# Patient Record
Sex: Female | Born: 1960
Health system: Southern US, Community
[De-identification: ages and names within clinical notes are randomized; demographics above are authoritative.]

## PROBLEM LIST (undated history)

## (undated) DIAGNOSIS — M719 Bursopathy, unspecified: Secondary | ICD-10-CM

## (undated) DIAGNOSIS — M329 Systemic lupus erythematosus, unspecified: Secondary | ICD-10-CM

## (undated) DIAGNOSIS — Z8616 Personal history of COVID-19: Secondary | ICD-10-CM

## (undated) DIAGNOSIS — I6381 Other cerebral infarction due to occlusion or stenosis of small artery: Secondary | ICD-10-CM

## (undated) DIAGNOSIS — R7303 Prediabetes: Secondary | ICD-10-CM

## (undated) DIAGNOSIS — S42309A Unspecified fracture of shaft of humerus, unspecified arm, initial encounter for closed fracture: Secondary | ICD-10-CM

## (undated) DIAGNOSIS — F329 Major depressive disorder, single episode, unspecified: Secondary | ICD-10-CM

## (undated) DIAGNOSIS — F411 Generalized anxiety disorder: Secondary | ICD-10-CM

## (undated) DIAGNOSIS — S43429A Sprain of unspecified rotator cuff capsule, initial encounter: Secondary | ICD-10-CM

## (undated) DIAGNOSIS — M1990 Unspecified osteoarthritis, unspecified site: Secondary | ICD-10-CM

## (undated) DIAGNOSIS — D595 Paroxysmal nocturnal hemoglobinuria [Marchiafava-Micheli]: Secondary | ICD-10-CM

## (undated) DIAGNOSIS — F909 Attention-deficit hyperactivity disorder, unspecified type: Secondary | ICD-10-CM

## (undated) DIAGNOSIS — G4733 Obstructive sleep apnea (adult) (pediatric): Secondary | ICD-10-CM

## (undated) HISTORY — DX: Sprain of unspecified rotator cuff capsule, initial encounter: S43.429A

## (undated) HISTORY — DX: Generalized anxiety disorder: F41.1

## (undated) HISTORY — DX: Major depressive disorder, single episode, unspecified: F32.9

## (undated) HISTORY — PX: SKIN GRAFT: SHX250

## (undated) HISTORY — DX: Other cerebral infarction due to occlusion or stenosis of small artery: I63.81

## (undated) HISTORY — DX: Obstructive sleep apnea (adult) (pediatric): G47.33

## (undated) HISTORY — DX: Unspecified osteoarthritis, unspecified site: M19.90

## (undated) HISTORY — DX: Unspecified fracture of shaft of humerus, unspecified arm, initial encounter for closed fracture: S42.309A

## (undated) HISTORY — PX: OTHER SURGICAL HISTORY: SHX169

## (undated) HISTORY — PX: WISDOM TOOTH EXTRACTION: SHX21

## (undated) HISTORY — PX: DENTAL SURGERY: SHX609

## (undated) HISTORY — DX: Personal history of COVID-19: Z86.16

## (undated) HISTORY — DX: Attention-deficit hyperactivity disorder, unspecified type: F90.9

## (undated) HISTORY — DX: Bursopathy, unspecified: M71.9

## (undated) HISTORY — DX: Prediabetes: R73.03

## (undated) HISTORY — PX: TONSILLECTOMY AND ADENOIDECTOMY: SUR1326

---

## 1984-11-09 HISTORY — PX: TUBAL LIGATION: SHX77

## 1999-07-08 ENCOUNTER — Ambulatory Visit (HOSPITAL_COMMUNITY): Admission: RE | Admit: 1999-07-08 | Discharge: 1999-07-08 | Payer: Self-pay | Admitting: *Deleted

## 2000-04-28 ENCOUNTER — Other Ambulatory Visit: Admission: RE | Admit: 2000-04-28 | Discharge: 2000-04-28 | Payer: Self-pay | Admitting: *Deleted

## 2001-04-13 ENCOUNTER — Other Ambulatory Visit: Admission: RE | Admit: 2001-04-13 | Discharge: 2001-04-13 | Payer: Self-pay | Admitting: *Deleted

## 2002-12-14 ENCOUNTER — Other Ambulatory Visit: Admission: RE | Admit: 2002-12-14 | Discharge: 2002-12-14 | Payer: Self-pay | Admitting: *Deleted

## 2004-10-27 ENCOUNTER — Other Ambulatory Visit: Admission: RE | Admit: 2004-10-27 | Discharge: 2004-10-27 | Payer: Self-pay | Admitting: *Deleted

## 2004-12-08 ENCOUNTER — Ambulatory Visit (HOSPITAL_COMMUNITY): Admission: RE | Admit: 2004-12-08 | Discharge: 2004-12-08 | Payer: Self-pay | Admitting: Gastroenterology

## 2004-12-08 ENCOUNTER — Encounter (INDEPENDENT_AMBULATORY_CARE_PROVIDER_SITE_OTHER): Payer: Self-pay | Admitting: *Deleted

## 2006-12-28 ENCOUNTER — Other Ambulatory Visit: Admission: RE | Admit: 2006-12-28 | Discharge: 2006-12-28 | Payer: Self-pay | Admitting: Obstetrics & Gynecology

## 2008-07-23 ENCOUNTER — Other Ambulatory Visit: Admission: RE | Admit: 2008-07-23 | Discharge: 2008-07-23 | Payer: Self-pay | Admitting: Obstetrics and Gynecology

## 2011-03-27 NOTE — Op Note (Signed)
NAME:  Diane Moody, Diane Moody              ACCOUNT NO.:  [id number]   MEDICAL RECORD NO.:  [id number]          PATIENT TYPE:  AMB   LOCATION:  ENDO                         FACILITY:  [facility]   PHYSICIAN:  Anselmo Rod, M.D.  DATE OF BIRTH:  09/21/1961   DATE OF PROCEDURE:  12/08/2004  DATE OF DISCHARGE:                                 OPERATIVE REPORT   PROCEDURE PERFORMED:  Colonoscopy, with multiple biopsies.   ENDOSCOPIST:  Anselmo Rod, M.D.   INSTRUMENT USED:  Olympus video colonoscope.   INDICATIONS FOR PROCEDURE:  Screening colonoscopy being performed in a 17-  year-old white female who has a family history of colon cancer in her  father, to rule out colonic polyps, masses, etc.   PRE-PROCEDURE PREPARATION:  Informed consent was procured from the patient.  The patient was fasted for eight hours prior to the procedure and prepped  with a bottle of magnesium citrate and a gallon of GoLYTELY the night prior  to the procedure.  Risks and benefits of the procedure including a 10% miss  rate of cancer and polyps was discussed with the patient as well.   PRE-PROCEDURE PHYSICAL:  VITAL SIGNS:  The patient had stable vital signs.  NECK:  Supple.  CHEST:  Clear to auscultation.  CARDIOVASCULAR:  S1, S2 regular.  ABDOMEN:  Soft, with normal bowel sounds.   DESCRIPTION OF THE PROCEDURE:  The patient was placed in the left lateral  decubitus position, sedated with 100 mg of Demerol and 10 mg of Versed in  slow incremental doses.  Once the patient was adequately sedated and  maintained on low-flow oxygen and continuous cardiac monitoring, the Olympus  video colonoscope was advanced from the rectum to the cecum.  There was a  large amount of residual stool in the colon.  Multiple washings were done.  The patient's position was changed from the left lateral to the supine and  the right lateral position, with gentle application of abdominal pressure to  reach the cecal base.  A patch of  erythema was biopsied from 10 cm.  Small  internal hemorrhoids were seen on retroflexion.  No other masses or polyps  were identified.  There was no evidence of diverticulosis.  Small mucosal  lesions could have been missed in spite of the multiple washings done.   IMPRESSION:  1.  Patchy erythema at 10 cm.  Multiple cold biopsies done.  2.  Small internal hemorrhoids seen on retroflexion.  3.  No masses or polyps seen.  4.  Significant amount of residual stool in the colon.  Small lesions could      have been missed.   RECOMMENDATIONS:  1.  Continue a high-fiber diet, with liberal fluid intake.  2.  Avoid nonsteroidals, including aspirin, for the next four weeks.  3.  Outpatient followup in the next two weeks for further recommendations.      JNM/MEDQ  D:  12/09/2004  T:  12/09/2004  Job:  [id number]   cc:   Milus Banister, P.A.   Talmadge Coventry, M.D.  [address]  [place]  [place]  [address]  Fax: [phone]

## 2012-01-19 LAB — HM PAP SMEAR: HM Pap smear: NEGATIVE

## 2012-03-09 DIAGNOSIS — E538 Deficiency of other specified B group vitamins: Secondary | ICD-10-CM | POA: Insufficient documentation

## 2012-03-09 HISTORY — DX: Deficiency of other specified B group vitamins: E53.8

## 2012-07-22 ENCOUNTER — Ambulatory Visit (INDEPENDENT_AMBULATORY_CARE_PROVIDER_SITE_OTHER): Payer: BC Managed Care – PPO | Admitting: Family Medicine

## 2012-07-22 VITALS — BP 101/68 | HR 89 | Temp 98.0°F | Resp 20 | Ht 66.5 in | Wt 184.0 lb

## 2012-07-22 DIAGNOSIS — T07XXXA Unspecified multiple injuries, initial encounter: Secondary | ICD-10-CM

## 2012-07-22 DIAGNOSIS — S0191XA Laceration without foreign body of unspecified part of head, initial encounter: Secondary | ICD-10-CM

## 2012-07-22 DIAGNOSIS — S0990XA Unspecified injury of head, initial encounter: Secondary | ICD-10-CM

## 2012-07-22 DIAGNOSIS — S0190XA Unspecified open wound of unspecified part of head, initial encounter: Secondary | ICD-10-CM

## 2012-07-22 DIAGNOSIS — W19XXXA Unspecified fall, initial encounter: Secondary | ICD-10-CM

## 2012-07-22 NOTE — Progress Notes (Signed)
Verbal consent obtained from the patient.  Local anesthesia with 3cc Lidocaine 2% with epinephrine.  Wound scrubbed with soap and water and rinsed.  Wound closed with 5 stainless steel staples.  Wound cleansed and dressed.

## 2012-07-22 NOTE — Progress Notes (Signed)
Subjective: Patient was working out steps on her caring some water tripped and fell down a couple of stairs twisted around. She may have hit her head on a car. She scraped and injured her right upper back. She is bleeding from the top of her head down behind her right ear. She thought he might have torn or your she has bruises on her elbow legs and feet.  She has a history of multiple disease processes. She has connective tissue disease for which he takes a number of medications. She is has lupus and paroxysmal nocturnal hemoglobinuria and vitamin B12 deficiency. She bruises and bleeds easily because of her medications. Her last tetanus shot was December of 2011.  She had no loss of consciousness. She went indoors rushes when your husband to come and gotten the time he tried to wash off the dirt and grime.  Objective: Blood in her hair. Examination reveals a 1 CM wound in the right occiput for the top of her head. This looks like a crush abrasion more than a clean cut wound. No other lacerations were noted on the head. Neck is supple. Full range of motion of her shoulders and arms. Marked abrasion and bruising from the right of her neck down her right scapula. She has a bruise on her right elbow. She has a bruise on her knee. There are 2 small puncture wounds on the top of her left foot. Multiple other small bruises on her legs.  Assessment: Abrasion and wound of scalp Fall Multiple contusions Large abrasion right scapula Polypharmacy leading to risk of falls Lupus  Plan: My physician assistant, Larey Days, will anesthetize and evaluate the wound as to whether it needs sutures or not.  She will have a lot of aches and pains, but I do not believe anything else was seriously injured.

## 2012-07-22 NOTE — Patient Instructions (Addendum)
WOUND CARE Please return in 7-10 days to have your stitches/staples removed or sooner if you have concerns. Marland Kitchen Keep area clean and dry for 24 hours. Do not remove bandage, if applied. . After 24 hours, remove bandage and wash wound gently with mild soap and warm water. Reapply a new bandage after cleaning wound, if directed. . Continue daily cleansing with soap and water until stitches/staples are removed. . Do not apply any ointments or creams to the wound while stitches/staples are in place, as this may cause delayed healing. . Notify the office if you experience any of the following signs of infection: Swelling, redness, pus drainage, streaking, fever >101.0 F . Notify the office if you experience excessive bleeding that does not stop after 15-20 minutes of constant, firm pressure.  If new problems are found come back in sooner

## 2012-07-30 ENCOUNTER — Ambulatory Visit (INDEPENDENT_AMBULATORY_CARE_PROVIDER_SITE_OTHER): Payer: BC Managed Care – PPO | Admitting: Family Medicine

## 2012-07-30 VITALS — BP 110/70 | HR 67 | Temp 98.3°F | Resp 16 | Ht 67.25 in | Wt 184.4 lb

## 2012-07-30 DIAGNOSIS — S0100XA Unspecified open wound of scalp, initial encounter: Secondary | ICD-10-CM

## 2012-07-30 NOTE — Progress Notes (Signed)
@[email or web address] @  Patient ID: Diane Moody MRN: [id number], DOB: [id number] y.o. Date of Encounter: 07/30/2012, 9:45 AM  Primary Physician: No primary provider on file.  Chief Complaint: Suture removal    See note from earlier this month  HPI: 51 y.o. y/o female with injury to scalp Here for suture removal s/p placement on right scalp Doing well - other bruises and sore areas are resolved No issues/complaints Afebrile/ No chills No erythema No pain Able to move without difficulty Normal sensation  No past medical history on file.   Home Meds: Prior to Admission medications   Medication Sig Start Date End Date Taking? Authorizing Provider  albuterol (PROVENTIL HFA;VENTOLIN HFA) 108 (90 BASE) MCG/ACT inhaler Inhale 2 puffs into the lungs as needed.   Yes Historical Provider, MD  aspirin 81 MG tablet Take 81 mg by mouth daily.   Yes Historical Provider, MD  bisacodyl (BISACODYL) 5 MG EC tablet Take 5 mg by mouth daily as needed.   Yes Historical Provider, MD  buPROPion (WELLBUTRIN XL) 150 MG 24 hr tablet Take 150 mg by mouth daily.   Yes Historical Provider, MD  Calcium Carbonate-Vitamin D (CALCIUM + D PO) Take by mouth daily.   Yes Historical Provider, MD  ciprofloxacin (CIPRO) 500 MG tablet Take 500 mg by mouth as needed.   Yes Historical Provider, MD  COENZYME Q-10 PO Take 60 mg by mouth daily.   Yes Historical Provider, MD  desloratadine (CLARINEX) 5 MG tablet Take 5 mg by mouth daily.   Yes Historical Provider, MD  diclofenac sodium (VOLTAREN) 1 % GEL Apply topically as needed.   Yes Historical Provider, MD  EPINEPHrine (EPIPEN JR) 0.15 MG/0.3ML injection Inject 0.15 mg into the muscle as needed.   Yes Historical Provider, MD  esomeprazole (NEXIUM) 20 MG capsule Take 20 mg by mouth daily before breakfast.   Yes Historical Provider, MD  folic acid (FOLVITE) 1 MG tablet Take 1 mg by mouth daily.   Yes Historical Provider, MD  Homeopathic Products (ARNICA) GEL Apply topically as  needed.   Yes Historical Provider, MD  hydroxychloroquine (PLAQUENIL) 200 MG tablet Take 200 mg by mouth daily.   Yes Historical Provider, MD  hydrOXYzine (ATARAX/VISTARIL) 25 MG tablet Take 25 mg by mouth 3 (three) times daily as needed.   Yes Historical Provider, MD  Lactobacillus Bifidus CAPS Take by mouth daily.   Yes Historical Provider, MD  LORazepam (ATIVAN) 1 MG tablet Take 1 mg by mouth every 8 (eight) hours.   Yes Historical Provider, MD  loteprednol (LOTEMAX) 0.5 % ophthalmic suspension Place 1 drop into both eyes 4 (four) times daily.   Yes Historical Provider, MD  metroNIDAZOLE (METROGEL) 1 % gel Apply topically daily.   Yes Historical Provider, MD  mometasone (NASONEX) 50 MCG/ACT nasal spray Place 2 sprays into the nose daily.   Yes Historical Provider, MD  montelukast (SINGULAIR) 10 MG tablet Take 10 mg by mouth at bedtime.   Yes Historical Provider, MD  mupirocin ointment (BACTROBAN) 2 % Apply topically 3 (three) times daily.   Yes Historical Provider, MD  naproxen sodium (ANAPROX) 220 MG tablet Take 220 mg by mouth 2 (two) times daily with a meal.   Yes Historical Provider, MD  omega-3 acid ethyl esters (LOVAZA) 1 G capsule Take 2 g by mouth 2 (two) times daily.   Yes Historical Provider, MD  PARoxetine (PAXIL) 20 MG tablet Take 20 mg by mouth every morning.   Yes Historical Provider, MD  predniSONE (DELTASONE)  5 MG tablet Take 5 mg by mouth daily.   Yes Historical Provider, MD  Pseudoephedrine HCl (SUDAFED 12 HOUR PO) Take by mouth as needed.   Yes Historical Provider, MD  tiZANidine (ZANAFLEX) 4 MG tablet Take 4 mg by mouth every 6 (six) hours as needed.   Yes Historical Provider, MD  zolpidem (AMBIEN) 10 MG tablet Take 10 mg by mouth at bedtime as needed.   Yes Historical Provider, MD    Allergies:  Allergies  Allergen Reactions  . Augmentin (Amoxicillin-Pot Clavulanate) Rash  . Celebrex (Celecoxib) Rash    Has a sulfa component.  . Nyquil (Pseudoeph-Doxylamine-Dm-Apap)  Rash  . Sulfa Antibiotics Rash    Physical Exam: Blood pressure 110/70, pulse 67, temperature 98.3 F (36.8 C), temperature source Oral, resp. rate 16, height 5' 7.25" (1.708 m), weight 184 lb 6.4 oz (83.643 kg), last menstrual period 07/01/2012, SpO2 97.00%., Body mass index is 28.67 kg/(m^2). General: Well developed, well nourished, in no acute distress. Head: Normocephalic, atraumatic, sclera non-icteric, no xanthomas, nares are without discharge.  Neck: Supple. Lungs: Breathing is unlabored. Heart: Normal rate. Msk:  Strength and tone appear normal for age. Wound:  Wound well health without erythema, swelling, or tenderness to palpation. FROM and 5/5 strength with normal sensation throughout including 2 point discrimination Skin: See above, otherwise dry without rash or erythema. Extremities: No clubbing or cyanosis. No edema. Neuro: Alert and oriented X 3. Moves all extremities spontaneously.  Psych:  Responds to questions appropriately with a normal affect.   PROCEDURE: Verbal consent obtained. 5 staples removed without difficulty.  Assessment and Plan: 51 y.o. y/o female here for suture removal for wound described above. -Sutures removed per above -Wound resolved -RTC prn  Signed, Elvina Sidle, MD 07/30/2012 9:45 AM

## 2012-11-17 ENCOUNTER — Encounter (HOSPITAL_COMMUNITY): Payer: Self-pay | Admitting: Cardiology

## 2012-11-17 ENCOUNTER — Emergency Department (HOSPITAL_COMMUNITY)
Admission: EM | Admit: 2012-11-17 | Discharge: 2012-11-17 | Disposition: A | Discharge status: Home / Self Care | Payer: BC Managed Care – PPO | Attending: Emergency Medicine | Admitting: Emergency Medicine

## 2012-11-17 ENCOUNTER — Emergency Department (HOSPITAL_COMMUNITY): Payer: BC Managed Care – PPO

## 2012-11-17 DIAGNOSIS — S335XXA Sprain of ligaments of lumbar spine, initial encounter: Secondary | ICD-10-CM | POA: Insufficient documentation

## 2012-11-17 DIAGNOSIS — Z79899 Other long term (current) drug therapy: Secondary | ICD-10-CM | POA: Insufficient documentation

## 2012-11-17 DIAGNOSIS — W11XXXA Fall on and from ladder, initial encounter: Secondary | ICD-10-CM | POA: Insufficient documentation

## 2012-11-17 DIAGNOSIS — W19XXXA Unspecified fall, initial encounter: Secondary | ICD-10-CM

## 2012-11-17 DIAGNOSIS — Y9389 Activity, other specified: Secondary | ICD-10-CM | POA: Insufficient documentation

## 2012-11-17 DIAGNOSIS — Z7982 Long term (current) use of aspirin: Secondary | ICD-10-CM | POA: Insufficient documentation

## 2012-11-17 DIAGNOSIS — Z862 Personal history of diseases of the blood and blood-forming organs and certain disorders involving the immune mechanism: Secondary | ICD-10-CM | POA: Insufficient documentation

## 2012-11-17 DIAGNOSIS — Y929 Unspecified place or not applicable: Secondary | ICD-10-CM | POA: Insufficient documentation

## 2012-11-17 DIAGNOSIS — Z8739 Personal history of other diseases of the musculoskeletal system and connective tissue: Secondary | ICD-10-CM | POA: Insufficient documentation

## 2012-11-17 DIAGNOSIS — S39012A Strain of muscle, fascia and tendon of lower back, initial encounter: Secondary | ICD-10-CM

## 2012-11-17 HISTORY — DX: Paroxysmal nocturnal hemoglobinuria (Marchiafava-Micheli): D59.5

## 2012-11-17 HISTORY — DX: Systemic lupus erythematosus, unspecified: M32.9

## 2012-11-17 LAB — URINALYSIS, ROUTINE W REFLEX MICROSCOPIC
Bilirubin Urine: NEGATIVE
Glucose, UA: NEGATIVE mg/dL
Hgb urine dipstick: NEGATIVE
Ketones, ur: NEGATIVE mg/dL
Leukocytes, UA: NEGATIVE
Nitrite: NEGATIVE
Protein, ur: NEGATIVE mg/dL
Specific Gravity, Urine: 1.015 (ref 1.005–1.030)
Urobilinogen, UA: 0.2 mg/dL (ref 0.0–1.0)
pH: 7 (ref 5.0–8.0)

## 2012-11-17 LAB — POCT I-STAT, CHEM 8
BUN: 17 mg/dL (ref 6–23)
Calcium, Ion: 1.19 mmol/L (ref 1.12–1.23)
Chloride: 102 mEq/L (ref 96–112)
Creatinine, Ser: 1 mg/dL (ref 0.50–1.10)
Glucose, Bld: 113 mg/dL — ABNORMAL HIGH (ref 70–99)
HCT: 39 % (ref 36.0–46.0)
Hemoglobin: 13.3 g/dL (ref 12.0–15.0)
Potassium: 3.9 mEq/L (ref 3.5–5.1)
Sodium: 138 mEq/L (ref 135–145)
TCO2: 27 mmol/L (ref 0–100)

## 2012-11-17 LAB — CBC WITH DIFFERENTIAL/PLATELET
Basophils Absolute: 0 10*3/uL (ref 0.0–0.1)
Basophils Relative: 0 % (ref 0–1)
Eosinophils Absolute: 0 10*3/uL (ref 0.0–0.7)
Eosinophils Relative: 0 % (ref 0–5)
HCT: 34.5 % — ABNORMAL LOW (ref 36.0–46.0)
Hemoglobin: 11.2 g/dL — ABNORMAL LOW (ref 12.0–15.0)
Lymphocytes Relative: 11 % — ABNORMAL LOW (ref 12–46)
Lymphs Abs: 0.6 10*3/uL — ABNORMAL LOW (ref 0.7–4.0)
MCH: 32.6 pg (ref 26.0–34.0)
MCHC: 32.5 g/dL (ref 30.0–36.0)
MCV: 100.3 fL — ABNORMAL HIGH (ref 78.0–100.0)
Monocytes Absolute: 0.6 10*3/uL (ref 0.1–1.0)
Monocytes Relative: 10 % (ref 3–12)
Neutro Abs: 4.8 10*3/uL (ref 1.7–7.7)
Neutrophils Relative %: 79 % — ABNORMAL HIGH (ref 43–77)
Platelets: 140 10*3/uL — ABNORMAL LOW (ref 150–400)
RBC: 3.44 MIL/uL — ABNORMAL LOW (ref 3.87–5.11)
RDW: 13.6 % (ref 11.5–15.5)
WBC: 6 10*3/uL (ref 4.0–10.5)

## 2012-11-17 MED ORDER — OXYCODONE-ACETAMINOPHEN 5-325 MG PO TABS
1.0000 | ORAL_TABLET | Freq: Once | ORAL | Status: AC
  Administered 2012-11-17: 1 via ORAL
  Filled 2012-11-17: qty 1

## 2012-11-17 MED ORDER — CYCLOBENZAPRINE HCL 10 MG PO TABS
10.0000 mg | ORAL_TABLET | Freq: Once | ORAL | Status: AC
  Administered 2012-11-17: 10 mg via ORAL
  Filled 2012-11-17: qty 1

## 2012-11-17 MED ORDER — OXYCODONE-ACETAMINOPHEN 5-325 MG PO TABS: 1.0000 | ORAL_TABLET | Freq: Four times a day (QID) | ORAL | Status: DC | PRN

## 2012-11-17 NOTE — ED Notes (Signed)
The pt is alert skin warm and dry.  She has pain in her lower back  And abd.  She describes it as like period cramps.  Urine just sent

## 2012-11-17 NOTE — ED Provider Notes (Signed)
History     CSN: [id number]  Arrival date & time 11/17/12  1158   First MD Initiated Contact with Patient 11/17/12 1213      Chief Complaint  Patient presents with  . Fall    (Consider location/radiation/quality/duration/timing/severity/associated sxs/prior treatment) Patient is a 52 y.o. female presenting with fall. The history is provided by the patient.  Fall The accident occurred 1 to 2 hours ago. The fall occurred from a ladder. She fell from a height of 3 to 5 ft. She landed on a hard floor. There was no blood loss. Point of impact: back and left hip. The pain is at a severity of 5/10. The pain is moderate. She was ambulatory at the scene. Pertinent negatives include no numbness, no abdominal pain, no nausea, no vomiting, no headaches, no loss of consciousness and no tingling. The symptoms are aggravated by activity. She has tried nothing for the symptoms. The treatment provided no relief.    Past Medical History  Diagnosis Date  . Lupus   . PNH (paroxysmal nocturnal hemoglobinuria)     Past Surgical History  Procedure Date  . Skin graft     History reviewed. No pertinent family history.  History  Substance Use Topics  . Smoking status: Never Smoker   . Smokeless tobacco: Not on file  . Alcohol Use: No    OB History    Grav Para Term Preterm Abortions TAB SAB Ect Mult Living                  Review of Systems  Gastrointestinal: Negative for nausea, vomiting and abdominal pain.  Neurological: Negative for tingling, loss of consciousness, numbness and headaches.  All other systems reviewed and are negative.    Allergies  Augmentin; Celebrex; Nyquil; and Sulfa antibiotics  Home Medications   Current Outpatient Rx  Name  Route  Sig  Dispense  Refill  . ALBUTEROL SULFATE HFA 108 (90 BASE) MCG/ACT IN AERS   Inhalation   Inhale 2 puffs into the lungs as needed.         . ASPIRIN 81 MG PO TABS   Oral   Take 81 mg by mouth daily.         Marland Kitchen BISACODYL  5 MG PO TBEC   Oral   Take 5 mg by mouth daily as needed.         . BUPROPION HCL ER (XL) 150 MG PO TB24   Oral   Take 150 mg by mouth daily.         Marland Kitchen CALCIUM + D PO   Oral   Take by mouth daily.         Marland Kitchen CIPROFLOXACIN HCL 500 MG PO TABS   Oral   Take 500 mg by mouth as needed.         Marland Kitchen COENZYME Q-10 PO   Oral   Take 60 mg by mouth daily.         . DESLORATADINE 5 MG PO TABS   Oral   Take 5 mg by mouth daily.         Marland Kitchen DICLOFENAC SODIUM 1 % TD GEL   Topical   Apply topically as needed.         Marland Kitchen EPINEPHRINE 0.15 MG/0.3ML IJ DEVI   Intramuscular   Inject 0.15 mg into the muscle as needed.         Marland Kitchen ESOMEPRAZOLE MAGNESIUM 20 MG PO CPDR   Oral   Take  20 mg by mouth daily before breakfast.         . FOLIC ACID 1 MG PO TABS   Oral   Take 1 mg by mouth daily.         . ARNICA EX GEL   Apply externally   Apply topically as needed.         Marland Kitchen HYDROXYCHLOROQUINE SULFATE 200 MG PO TABS   Oral   Take 200 mg by mouth daily.         Marland Kitchen HYDROXYZINE HCL 25 MG PO TABS   Oral   Take 25 mg by mouth 3 (three) times daily as needed.         Marland Kitchen LACTOBACILLUS BIFIDUS PO CAPS   Oral   Take by mouth daily.         Marland Kitchen LORAZEPAM 1 MG PO TABS   Oral   Take 1 mg by mouth every 8 (eight) hours.         Marland Kitchen LOTEPREDNOL ETABONATE 0.5 % OP SUSP   Both Eyes   Place 1 drop into both eyes 4 (four) times daily.         Marland Kitchen METRONIDAZOLE 1 % EX GEL   Topical   Apply topically daily.         . MOMETASONE FUROATE 50 MCG/ACT NA SUSP   Nasal   Place 2 sprays into the nose daily.         Marland Kitchen MONTELUKAST SODIUM 10 MG PO TABS   Oral   Take 10 mg by mouth at bedtime.         Marland Kitchen MUPIROCIN 2 % EX OINT   Topical   Apply topically 3 (three) times daily.         Marland Kitchen NAPROXEN SODIUM 220 MG PO TABS   Oral   Take 220 mg by mouth 2 (two) times daily with a meal.         . OMEGA-3-ACID ETHYL ESTERS 1 G PO CAPS   Oral   Take 2 g by mouth 2 (two) times  daily.         Marland Kitchen PAROXETINE HCL 20 MG PO TABS   Oral   Take 20 mg by mouth every morning.         Marland Kitchen PREDNISONE 5 MG PO TABS   Oral   Take 5 mg by mouth daily.         . SUDAFED 12 HOUR PO   Oral   Take by mouth as needed.         Marland Kitchen TIZANIDINE HCL 4 MG PO TABS   Oral   Take 4 mg by mouth every 6 (six) hours as needed.         Marland Kitchen ZOLPIDEM TARTRATE 10 MG PO TABS   Oral   Take 10 mg by mouth at bedtime as needed.           BP 117/66  Temp 97.9 F (36.6 C) (Oral)  Resp 20  SpO2 97%  Physical Exam  Nursing note and vitals reviewed. Constitutional: She is oriented to person, place, and time. She appears well-developed and well-nourished. No distress.  HENT:  Head: Normocephalic and atraumatic.  Mouth/Throat: Oropharynx is clear and moist.  Eyes: Conjunctivae normal and EOM are normal. Pupils are equal, round, and reactive to light.  Neck: Normal range of motion. Neck supple.  Cardiovascular: Normal rate, regular rhythm and intact distal pulses.   No murmur heard. Pulmonary/Chest: Effort normal and breath  sounds normal. No respiratory distress. She has no wheezes. She has no rales. She exhibits no tenderness.  Abdominal: Soft. She exhibits no distension. There is no tenderness. There is no rebound and no guarding.  Musculoskeletal: Normal range of motion. She exhibits no edema and no tenderness.       Lumbar back: She exhibits tenderness, pain and spasm. She exhibits normal range of motion, no bony tenderness, no deformity and normal pulse.       Back:       Right hand: She exhibits tenderness and swelling. She exhibits normal range of motion, normal capillary refill, no deformity and no laceration.       Hands: Neurological: She is alert and oriented to person, place, and time.  Skin: Skin is warm and dry. No rash noted. No erythema.  Psychiatric: She has a normal mood and affect. Her behavior is normal.    ED Course  Procedures (including critical care  time)  Labs Reviewed  CBC WITH DIFFERENTIAL - Abnormal; Notable for the following:    RBC 3.44 (*)     Hemoglobin 11.2 (*)     HCT 34.5 (*)     MCV 100.3 (*)     Platelets 140 (*)     Neutrophils Relative 79 (*)     Lymphocytes Relative 11 (*)     Lymphs Abs 0.6 (*)     All other components within normal limits  POCT I-STAT, CHEM 8 - Abnormal; Notable for the following:    Glucose, Bld 113 (*)     All other components within normal limits  URINALYSIS, ROUTINE W REFLEX MICROSCOPIC   Dg Lumbar Spine Complete  11/17/2012  *RADIOLOGY REPORT*  Clinical Data: Fall, back pain  LUMBAR SPINE - COMPLETE 4+ VIEW  Comparison: None.  Findings: Five views of the lumbar spine submitted.  There is mild compression deformity superior endplate of the L2 vertebral body of indeterminate age.  Clinical correlation is necessary. Disc space flattening with vacuum disc phenomenon and mild anterior spurring at L5-S1 level.  Mild disc space flattening at L2-L3 level.  IMPRESSION:  There is mild compression deformity superior endplate of the L2 vertebral body of indeterminate age.  Clinical correlation is necessary. Disc space flattening with vacuum disc phenomenon and mild anterior spurring at L5-S1 level.  Mild disc space flattening at L2-L3 level.   Original Report Authenticated By: Natasha Mead, M.D.    Dg Hip Complete Left  11/17/2012  *RADIOLOGY REPORT*  Clinical Data: Fall  LEFT HIP - COMPLETE 2+ VIEW  Comparison: None.  Findings: Three views of the left hip submitted.  No acute fracture or subluxation.  Bilateral hip joints are symmetrical in appearance.  IMPRESSION: No acute fracture or subluxation.   Original Report Authenticated By: Natasha Mead, M.D.     EMERGENCY DEPARTMENT [place] FAST EXAM  INDICATIONS: blunt trauma to the back from fall  PERFORMED BY: Myself  IMAGES ARCHIVED?: No  FINDINGS: All views negative  LIMITATIONS:  none  INTERPRETATION:  No abdominal free fluid and No pericardial  effusion  COMMENT:  normal    1. Fall   2. Lumbar strain       MDM   Patient with a mechanical fall today from a ladder when she was having a bird feeder. She fell flat on her back and is complaining of left lower para lumbar tenderness. She denies any difficulty urinating and denies any chest pain or shortness of breath. Bedside ultrasound shows normal bilateral kidneys and  bladder without any signs of free fluid.  Patient is on aspirin daily but takes no other anticoagulants. She denies head or head or lose consciousness. Pt does have PNH but states her labs within the last week were wnl.  2:27 PM Films neg except for possible indeterminate L2 endplate deformity.  However pt is not tender over the spine and suspect this is not new.  Hip film neg.  Labs stable.  Pt able to ambulate without pain. Will d/c pt home.        Gwyneth Sprout, MD 11/17/12 1559

## 2012-11-17 NOTE — ED Notes (Signed)
Got pt up, ambulatory to the bathroom to attempt to provide an urine specimen; pt ambulating without difficulty or distress

## 2012-11-17 NOTE — ED Notes (Signed)
Pt reports she was hanging a bird feeder and fell off the ladder about 5 ft. States she landed on her left hip and leg. C/o lower back pain, denies any LOC. No on any blood thinners.

## 2012-12-13 ENCOUNTER — Encounter: Payer: Self-pay | Admitting: Family Medicine

## 2012-12-24 ENCOUNTER — Other Ambulatory Visit: Payer: Self-pay

## 2013-01-26 LAB — HM MAMMOGRAPHY: HM Mammogram: NEGATIVE

## 2013-02-24 DIAGNOSIS — M329 Systemic lupus erythematosus, unspecified: Secondary | ICD-10-CM

## 2013-02-24 DIAGNOSIS — M797 Fibromyalgia: Secondary | ICD-10-CM

## 2013-02-24 HISTORY — DX: Fibromyalgia: M79.7

## 2013-02-24 HISTORY — DX: Systemic lupus erythematosus, unspecified: M32.9

## 2013-05-08 DIAGNOSIS — R7989 Other specified abnormal findings of blood chemistry: Secondary | ICD-10-CM

## 2013-05-08 DIAGNOSIS — IMO0002 Reserved for concepts with insufficient information to code with codable children: Secondary | ICD-10-CM

## 2013-05-08 HISTORY — DX: Reserved for concepts with insufficient information to code with codable children: IMO0002

## 2013-05-08 HISTORY — DX: Other specified abnormal findings of blood chemistry: R79.89

## 2013-09-14 ENCOUNTER — Other Ambulatory Visit: Payer: Self-pay

## 2013-10-17 ENCOUNTER — Ambulatory Visit (INDEPENDENT_AMBULATORY_CARE_PROVIDER_SITE_OTHER): Payer: BC Managed Care – PPO | Admitting: Family Medicine

## 2013-10-17 VITALS — BP 134/76 | HR 81 | Temp 98.1°F | Resp 16 | Ht 67.25 in | Wt 182.0 lb

## 2013-10-17 DIAGNOSIS — J069 Acute upper respiratory infection, unspecified: Secondary | ICD-10-CM

## 2013-10-17 DIAGNOSIS — M329 Systemic lupus erythematosus, unspecified: Secondary | ICD-10-CM

## 2013-10-17 DIAGNOSIS — D849 Immunodeficiency, unspecified: Secondary | ICD-10-CM | POA: Insufficient documentation

## 2013-10-17 DIAGNOSIS — D899 Disorder involving the immune mechanism, unspecified: Secondary | ICD-10-CM

## 2013-10-17 DIAGNOSIS — J019 Acute sinusitis, unspecified: Secondary | ICD-10-CM

## 2013-10-17 HISTORY — DX: Immunodeficiency, unspecified: D84.9

## 2013-10-17 LAB — POCT CBC
Granulocyte percent: 78.8 %G (ref 37–80)
HCT, POC: 41.1 % (ref 37.7–47.9)
Hemoglobin: 12.6 g/dL (ref 12.2–16.2)
Lymph, poc: 1 (ref 0.6–3.4)
MCH, POC: 31.7 pg — AB (ref 27–31.2)
MCHC: 30.7 g/dL — AB (ref 31.8–35.4)
MCV: 103.5 fL — AB (ref 80–97)
MID (cbc): 0.4 (ref 0–0.9)
MPV: 7.2 fL (ref 0–99.8)
POC Granulocyte: 5.4 (ref 2–6.9)
POC LYMPH PERCENT: 15 %L (ref 10–50)
POC MID %: 6.2 %M (ref 0–12)
Platelet Count, POC: 197 10*3/uL (ref 142–424)
RBC: 3.97 M/uL — AB (ref 4.04–5.48)
RDW, POC: 14.8 %
WBC: 6.9 10*3/uL (ref 4.6–10.2)

## 2013-10-17 MED ORDER — LEVOFLOXACIN 500 MG PO TABS: 500.0000 mg | ORAL_TABLET | Freq: Every day | ORAL | Status: DC

## 2013-10-17 MED ORDER — ALBUTEROL SULFATE HFA 108 (90 BASE) MCG/ACT IN AERS: 2.0000 | INHALATION_SPRAY | RESPIRATORY_TRACT | Status: AC | PRN

## 2013-10-17 MED ORDER — BENZONATATE 200 MG PO CAPS: 200.0000 mg | ORAL_CAPSULE | Freq: Three times a day (TID) | ORAL | Status: DC | PRN

## 2013-10-17 MED ORDER — METHYLPREDNISOLONE 4 MG PO KIT: PACK | ORAL | Status: DC

## 2013-10-17 NOTE — Progress Notes (Addendum)
Subjective:    Patient ID: Diane Moody, female    DOB: 17-Moody-1962, 52 y.o.   MRN: [id number] This chart was scribed for Sherren Mocha, MD by Valera Castle, ED Scribe. This patient was seen in room 8 and the patient's care was started at 9:49 AM.  Chief Complaint  Patient presents with  . URI    * about 2 weeks- has been doing Mucinex DM and Sudafed  . Cough    bad now- needs refill for Albuterol    HPI Diane Moody is a 52 y.o. female who presents to the [facility] complaining of URI symptoms, including cough, onset 2 weeks ago.  Pt is on Soliris for her h/o paroxysmal nocturnal hemoglobinuria which can lower her immune system as well as Plaquenil and Prednisone.  She states that her URI symptoms have subsided and returned off and on since 06/2013. She denies knowing if she has had a fever due to not having a thermometer at home. She reports productive cough. She reports rhinorrhea, with yellow, pus-like discharge. She also reports intermittent bloody discharge from her nose. She reports some right sided sinus pressure. She states her teeth hurt. She reports having used up her Albuterol inhaler due to SOB. She saw HENT who told her it was allergies.She has tried Sudafed, Zertex, Mucinex, Benadryl., Tylenol, prescription antihistamines, Nasanex, Saline, herbs, inhalers, etc for the symptoms without relief. She states HENT told her to come back for a CT scan of sinuses if her sxs continued but she is reluctant to jump to that step. She reports a strong allergy to [facility] trees, Augmentin, Celebrex, Nyquil, and Sulfa Antibiotics. She denies any other associated symptoms.   PCP - No primary provider on file.  There are no active problems to display for this patient.  Past Medical History  Diagnosis Date  . Lupus   . PNH (paroxysmal nocturnal hemoglobinuria)    Past Surgical History  Procedure Laterality Date  . Skin graft     Allergies  Allergen Reactions  . Augmentin [Amoxicillin-Pot  Clavulanate] Rash  . Celebrex [Celecoxib] Rash    Has a sulfa component.  . Nyquil [Pseudoeph-Doxylamine-Dm-Apap] Rash  . Sulfa Antibiotics Rash   Prior to Admission medications   Medication Sig Start Date End Date Taking? Authorizing Provider  albuterol (PROVENTIL HFA;VENTOLIN HFA) 108 (90 BASE) MCG/ACT inhaler Inhale 2 puffs into the lungs as needed. For shortness of breath   Yes Historical Provider, MD  amphetamine-dextroamphetamine (ADDERALL) 20 MG tablet Take 20 mg by mouth daily.   Yes Historical Provider, MD  aspirin 81 MG tablet Take 81 mg by mouth daily.   Yes Historical Provider, MD  bisacodyl (BISACODYL) 5 MG EC tablet Take 5 mg by mouth daily as needed. For constipation   Yes Historical Provider, MD  buPROPion (WELLBUTRIN XL) 150 MG 24 hr tablet Take 300-450 mg by mouth daily.    Yes Historical Provider, MD  Calcium Carbonate-Vitamin D (CALCIUM + D PO) Take 1 tablet by mouth daily.    Yes Historical Provider, MD  desloratadine (CLARINEX) 5 MG tablet Take 5 mg by mouth daily.   Yes Historical Provider, MD  diclofenac sodium (VOLTAREN) 1 % GEL Apply topically as needed.   Yes Historical Provider, MD  Eculizumab (SOLIRIS IV) Inject 900 mg into the vein every 14 (fourteen) days.   Yes Historical Provider, MD  EPINEPHrine (EPIPEN JR) 0.15 MG/0.3ML injection Inject 0.15 mg into the muscle as needed.   Yes Historical Provider, MD  gabapentin (NEURONTIN)  100 MG capsule Take 300 mg by mouth at bedtime.   Yes Historical Provider, MD  hydroxychloroquine (PLAQUENIL) 200 MG tablet Take 400 mg by mouth daily.    Yes Historical Provider, MD  hydrOXYzine (ATARAX/VISTARIL) 25 MG tablet Take 25 mg by mouth 3 (three) times daily as needed.   Yes Historical Provider, MD  IRON-B12-VITAMINS IM Inject 1 mg into the muscle every 30 (thirty) days.   Yes Historical Provider, MD  L-Methylfolate (DEPLIN) 15 MG TABS Take 1 tablet by mouth daily.   Yes Historical Provider, MD  Lactobacillus Bifidus CAPS Take 1  capsule by mouth daily.    Yes Historical Provider, MD  LORazepam (ATIVAN) 1 MG tablet Take 1 mg by mouth every 6 (six) hours as needed. For anxiety   Yes Historical Provider, MD  loteprednol (LOTEMAX) 0.5 % ophthalmic suspension Place 1 drop into both eyes 4 (four) times daily.   Yes Historical Provider, MD  mometasone (NASONEX) 50 MCG/ACT nasal spray Place 2 sprays into the nose daily.   Yes Historical Provider, MD  montelukast (SINGULAIR) 10 MG tablet Take 10 mg by mouth at bedtime.   Yes Historical Provider, MD  mupirocin ointment (BACTROBAN) 2 % Apply topically 3 (three) times daily.   Yes Historical Provider, MD  omega-3 acid ethyl esters (LOVAZA) 1 G capsule Take 2 g by mouth 2 (two) times daily.   Yes Historical Provider, MD  PARoxetine (PAXIL) 20 MG tablet Take 20 mg by mouth every morning.   Yes Historical Provider, MD  predniSONE (DELTASONE) 5 MG tablet Take 5 mg by mouth daily. Take with prednisone 1mg    Yes Historical Provider, MD  zolpidem (AMBIEN) 10 MG tablet Take 10 mg by mouth at bedtime as needed.   Yes Historical Provider, MD   No family history on file. History   Social History  . Marital Status: Married    Spouse Name: N/A    Number of Children: N/A  . Years of Education: N/A   Occupational History  . Not on file.   Social History Main Topics  . Smoking status: Never Smoker   . Smokeless tobacco: Not on file  . Alcohol Use: No  . Drug Use: No  . Sexual Activity: Not Currently   Other Topics Concern  . Not on file   Social History Narrative  . No narrative on file    Review of Systems  Constitutional: Positive for fatigue. Negative for fever, chills, diaphoresis, activity change and appetite change.  HENT: Positive for congestion, dental problem (teeth are tender), ear pain, postnasal drip, rhinorrhea, sinus pressure, sneezing and sore throat. Negative for ear discharge, nosebleeds, trouble swallowing and voice change.   Eyes: Negative for discharge and  itching.  Respiratory: Positive for cough and shortness of breath. Negative for chest tightness.   Cardiovascular: Negative for chest pain.  Gastrointestinal: Negative for nausea, vomiting and abdominal pain.  Musculoskeletal: Negative for neck pain and neck stiffness.  Skin: Negative for rash.  Neurological: Positive for headaches. Negative for dizziness and syncope.  Hematological: Positive for adenopathy.  Psychiatric/Behavioral: Positive for sleep disturbance.    BP 134/76  Pulse 81  Temp(Src) 98.1 F (36.7 C) (Oral)  Resp 16  Ht 5' 7.25" (1.708 m)  Wt 182 lb (82.555 kg)  BMI 28.30 kg/m2  SpO2 98%  LMP 09/29/2013     Objective:   Physical Exam  Nursing note and vitals reviewed. Constitutional: She is oriented to person, place, and time. She appears well-developed and well-nourished.  No distress.  HENT:  Head: Normocephalic and atraumatic.  Right Ear: Tympanic membrane is retracted. A middle ear effusion is present.  Left Ear: Tympanic membrane, external ear and ear canal normal.  Nose: Rhinorrhea present.  Mouth/Throat: Uvula is midline and mucous membranes are normal. No oropharyngeal exudate, posterior oropharyngeal erythema or tonsillar abscesses.  White streaking noted over post oropharyngeal.  Eyes: EOM are normal.  Neck: Trachea normal. Neck supple. No tracheal deviation present. No mass and no thyromegaly present.  Cardiovascular: Normal rate, regular rhythm and normal heart sounds.  Exam reveals no gallop and no friction rub.   No murmur heard. Pulmonary/Chest: Effort normal and breath sounds normal. No respiratory distress. She has no wheezes. She has no rales. She exhibits no tenderness.  Musculoskeletal: Normal range of motion.  Lymphadenopathy:    She has no cervical adenopathy.  Neurological: She is alert and oriented to person, place, and time.  Skin: Skin is warm and dry. She is not diaphoretic.  Psychiatric: She has a normal mood and affect. Her  behavior is normal.   Results for orders placed in visit on 10/17/13  POCT CBC      Result Value Range   WBC 6.9  4.6 - 10.2 K/uL   Lymph, poc 1.0  0.6 - 3.4   POC LYMPH PERCENT 15.0  10 - 50 %L   MID (cbc) 0.4  0 - 0.9   POC MID % 6.2  0 - 12 %M   POC Granulocyte 5.4  2 - 6.9   Granulocyte percent 78.8  37 - 80 %G   RBC 3.97 (*) 4.04 - 5.48 M/uL   Hemoglobin 12.6  12.2 - 16.2 g/dL   HCT, POC 60.4  54.0 - 47.9 %   MCV 103.5 (*) 80 - 97 fL   MCH, POC 31.7 (*) 27 - 31.2 pg   MCHC 30.7 (*) 31.8 - 35.4 g/dL   RDW, POC 98.1     Platelet Count, POC 197  142 - 424 K/uL   MPV 7.2  0 - 99.8 fL       Assessment & Plan:   Lupus  Immunosuppressed status - Plan: POCT CBC  Recurrent URI (upper respiratory infection) - Plan: POCT CBC'  Sinusitis - suspect bacterial due to protracted course and sxs, due to augmentin and sulfa allergy, will treat w/ levaquin and prednisone taper.  RTC if sxs cont or worsen.  Meds ordered this encounter  Medications  . gabapentin (NEURONTIN) 100 MG capsule    Sig: Take 300 mg by mouth at bedtime.  Marland Kitchen L-Methylfolate (DEPLIN) 15 MG TABS    Sig: Take 1 tablet by mouth daily.  . Eculizumab (SOLIRIS IV)    Sig: Inject 900 mg into the vein every 14 (fourteen) days.  Marland Kitchen IRON-B12-VITAMINS IM    Sig: Inject 1 mg into the muscle every 30 (thirty) days.  Marland Kitchen desloratadine (CLARINEX) 5 MG tablet    Sig: Take 5 mg by mouth daily.  Marland Kitchen albuterol (PROVENTIL HFA;VENTOLIN HFA) 108 (90 BASE) MCG/ACT inhaler    Sig: Inhale 2 puffs into the lungs every 4 (four) hours as needed for wheezing or shortness of breath. For shortness of breath    Dispense:  1 Inhaler    Refill:  3  . levofloxacin (LEVAQUIN) 500 MG tablet    Sig: Take 1 tablet (500 mg total) by mouth daily.    Dispense:  10 tablet    Refill:  0  . methylPREDNISolone (MEDROL, PAK,) 4  MG tablet    Sig: follow package directions    Dispense:  21 tablet    Refill:  0    I personally performed the services  described in this documentation, which was scribed in my presence. The recorded information has been reviewed and considered, and addended by me as needed.  Norberto Sorenson, MD MPH

## 2013-11-09 DIAGNOSIS — M719 Bursopathy, unspecified: Secondary | ICD-10-CM

## 2013-11-09 HISTORY — DX: Bursopathy, unspecified: M71.9

## 2014-01-19 ENCOUNTER — Encounter: Payer: Self-pay | Admitting: Nurse Practitioner

## 2014-01-22 ENCOUNTER — Ambulatory Visit: Payer: BC Managed Care – PPO | Admitting: Nurse Practitioner

## 2014-01-22 ENCOUNTER — Telehealth: Payer: Self-pay | Admitting: Nurse Practitioner

## 2014-01-22 NOTE — Telephone Encounter (Signed)
Pt had to reschedule her aex because she was late. Wondering if she should schedule her mammogram before coming in on 4/21.

## 2014-01-23 NOTE — Telephone Encounter (Signed)
Patient received a letter and has questions about mammogram imaging. Advised to schedule at [facility] and can do 3D mammogram if she chooses. She will schedule at this time.  Routing to provider for final review. Patient agreeable to disposition. Will close encounter

## 2014-01-23 NOTE — Telephone Encounter (Signed)
Message left to return call to Tracy at [phone].    

## 2014-02-27 ENCOUNTER — Telehealth: Payer: Self-pay | Admitting: Nurse Practitioner

## 2014-02-27 ENCOUNTER — Encounter: Payer: Self-pay | Admitting: Nurse Practitioner

## 2014-02-27 ENCOUNTER — Ambulatory Visit (INDEPENDENT_AMBULATORY_CARE_PROVIDER_SITE_OTHER): Payer: BC Managed Care – PPO | Admitting: Nurse Practitioner

## 2014-02-27 VITALS — BP 130/70 | HR 72 | Resp 18 | Ht 67.25 in | Wt 187.0 lb

## 2014-02-27 DIAGNOSIS — Z0000 Encounter for general adult medical examination without abnormal findings: Secondary | ICD-10-CM

## 2014-02-27 DIAGNOSIS — Z01419 Encounter for gynecological examination (general) (routine) without abnormal findings: Secondary | ICD-10-CM

## 2014-02-27 LAB — POCT URINALYSIS DIPSTICK
Bilirubin, UA: NEGATIVE
Blood, UA: NEGATIVE
Glucose, UA: NEGATIVE
Ketones, UA: NEGATIVE
Leukocytes, UA: NEGATIVE
Nitrite, UA: NEGATIVE
Protein, UA: NEGATIVE
Urobilinogen, UA: NEGATIVE
pH, UA: 5

## 2014-02-27 NOTE — Telephone Encounter (Signed)
Patient was called about her sister's genetic testing for breast cancer.  Since her sister is not our patient it would be invasion of privacy for us to open the chart to get information.  The patient was told to [facility]. She is understanding and agreeable.
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
## 2014-02-27 NOTE — Patient Instructions (Addendum)

## 2014-03-03 NOTE — Progress Notes (Signed)
Encounter reviewed by Dr. Temiloluwa Laredo Silva.  

## 2014-07-30 ENCOUNTER — Other Ambulatory Visit: Payer: Self-pay | Admitting: Nurse Practitioner

## 2014-07-30 ENCOUNTER — Telehealth: Payer: Self-pay | Admitting: Nurse Practitioner

## 2014-07-30 MED ORDER — MEDROXYPROGESTERONE ACETATE 10 MG PO TABS: 10.0000 mg | ORAL_TABLET | Freq: Every day | ORAL | Status: DC

## 2014-07-30 NOTE — Telephone Encounter (Signed)
Spoke with patient. Patient states that she has not had a cycle in over three months and was told to call in so that she could get started on something. Advised patient per last office visit note on 02/27/14 if no cycle for three months Lauro Franklin, FNP wanted to start patient on Provera. Advised patient will take for 10 days then wait two weeks after completion to see if this generates a cycle. Advised will need to call back with positive or negative bleed. Patient is agreeable. Advised patient will send a message to Lauro Franklin, FNP to make sure this is still how she would like to proceed at this time and give patient a call back once order has been placed or if there are any further recommendations. Patient agreeable.  Lauro Franklin, FNP okay to order Provera at this time?

## 2014-07-30 NOTE — Telephone Encounter (Signed)
Yes she needs Provera challenge.  Then call with +/ - results.  Also call if prolonged bleeding or AUB.  Order sent into Epic for the Provera.

## 2014-07-30 NOTE — Telephone Encounter (Signed)
Pt says she has not had a period in more than 3 months. Please call to advise.

## 2014-07-31 NOTE — Telephone Encounter (Signed)
Spoke with patient. Advised rx sent in to pharmacy. Patient is agreeable and will call with positive or negative bleed and if bleeding is prolonged.  Routing to provider for final review. Patient agreeable to disposition. Will close encounter

## 2014-08-07 ENCOUNTER — Other Ambulatory Visit: Payer: Self-pay | Admitting: Nurse Practitioner

## 2014-08-08 DIAGNOSIS — G2581 Restless legs syndrome: Secondary | ICD-10-CM | POA: Insufficient documentation

## 2014-08-08 DIAGNOSIS — R21 Rash and other nonspecific skin eruption: Secondary | ICD-10-CM | POA: Insufficient documentation

## 2014-08-08 HISTORY — DX: Rash and other nonspecific skin eruption: R21

## 2014-08-08 HISTORY — DX: Restless legs syndrome: G25.81

## 2014-09-10 ENCOUNTER — Encounter: Payer: Self-pay | Admitting: Nurse Practitioner

## 2014-09-17 ENCOUNTER — Telehealth: Payer: Self-pay

## 2014-09-17 DIAGNOSIS — N912 Amenorrhea, unspecified: Secondary | ICD-10-CM

## 2014-09-17 NOTE — Telephone Encounter (Signed)
Spoke with patient. Patient states that she started taking Provera on 9/21 for ten days. Patient has not yet had cycle. Advised patient will speak with Lauro FranklinPatricia Rolen-Grubb, FNP to let her know and return call with recommendations and instructions. Patient is agreeable. This was patient's first Provera challenge.

## 2014-09-17 NOTE — Telephone Encounter (Signed)
Since no menses with Provera challenge her chance of endo cancer is reduced.   [facility]  [facility]  That way we can see if menopausal. This does not mean she has to take HRT.  But if she gets a lot of vaso symptoms we can then discuss HRT.

## 2014-09-17 NOTE — Telephone Encounter (Signed)
Please see telephone encounter 07/30/14.  Pt is stating she has not had a period. Pt would like a call back.

## 2014-09-18 NOTE — Telephone Encounter (Signed)
Spoke with patient. Advised patient of message from Lauro FranklinPatricia Rolen-Grubb, FNP as seen below. Patient is agreeable and verbalizes understanding. Patient does not desire to discuss HRT at this time. Patient's husband is traveling and she will need to call back to schedule lab appointment. [facility].  Routing to provider for final review. Patient agreeable to disposition. Will close encounter

## 2014-09-20 ENCOUNTER — Other Ambulatory Visit: Payer: BC Managed Care – PPO

## 2014-09-20 DIAGNOSIS — N912 Amenorrhea, unspecified: Secondary | ICD-10-CM

## 2014-09-21 LAB — FOLLICLE STIMULATING HORMONE: FSH: 42.8 m[IU]/mL

## 2014-09-24 DIAGNOSIS — J4520 Mild intermittent asthma, uncomplicated: Secondary | ICD-10-CM

## 2014-09-24 HISTORY — DX: Mild intermittent asthma, uncomplicated: J45.20

## 2014-10-18 ENCOUNTER — Encounter: Payer: Self-pay | Admitting: Nurse Practitioner

## 2014-10-19 ENCOUNTER — Telehealth: Payer: Self-pay | Admitting: Nurse Practitioner

## 2014-10-19 NOTE — Telephone Encounter (Signed)
Left message to call Corianne Buccellato at [phone]. 

## 2014-10-19 NOTE — Telephone Encounter (Signed)
Pt is in menopause and wondering if patty has any information on DHEA that she is interested in.

## 2014-10-23 NOTE — Telephone Encounter (Signed)
Spoke with patient. Patient states that she is in the car right now and can not remember the name of the DHEA she had questions about for Patty. "I have been working outside all day. I just can't remember. I am okay for now. I will call back sometime." Patient will return call with information to see what Lauro FranklinPatricia Rolen-Grubb, FNP recommends at a later date when she is able to look up the DHEA again.  Routing to provider for final review. Patient agreeable to disposition. Will close encounter

## 2014-11-08 ENCOUNTER — Emergency Department (HOSPITAL_COMMUNITY)
Admission: EM | Admit: 2014-11-08 | Discharge: 2014-11-08 | Disposition: A | Discharge status: Home / Self Care | Payer: BC Managed Care – PPO | Attending: Emergency Medicine | Admitting: Emergency Medicine

## 2014-11-08 ENCOUNTER — Encounter (HOSPITAL_COMMUNITY): Payer: Self-pay | Admitting: *Deleted

## 2014-11-08 DIAGNOSIS — Z792 Long term (current) use of antibiotics: Secondary | ICD-10-CM | POA: Insufficient documentation

## 2014-11-08 DIAGNOSIS — Z23 Encounter for immunization: Secondary | ICD-10-CM | POA: Diagnosis not present

## 2014-11-08 DIAGNOSIS — S60511A Abrasion of right hand, initial encounter: Secondary | ICD-10-CM

## 2014-11-08 DIAGNOSIS — Y9389 Activity, other specified: Secondary | ICD-10-CM | POA: Diagnosis not present

## 2014-11-08 DIAGNOSIS — Z7952 Long term (current) use of systemic steroids: Secondary | ICD-10-CM | POA: Insufficient documentation

## 2014-11-08 DIAGNOSIS — Z79899 Other long term (current) drug therapy: Secondary | ICD-10-CM | POA: Diagnosis not present

## 2014-11-08 DIAGNOSIS — M3210 Systemic lupus erythematosus, organ or system involvement unspecified: Secondary | ICD-10-CM | POA: Diagnosis not present

## 2014-11-08 DIAGNOSIS — Z7951 Long term (current) use of inhaled steroids: Secondary | ICD-10-CM | POA: Diagnosis not present

## 2014-11-08 DIAGNOSIS — Y9289 Other specified places as the place of occurrence of the external cause: Secondary | ICD-10-CM | POA: Diagnosis not present

## 2014-11-08 DIAGNOSIS — S51831A Puncture wound without foreign body of right forearm, initial encounter: Secondary | ICD-10-CM | POA: Insufficient documentation

## 2014-11-08 DIAGNOSIS — Y998 Other external cause status: Secondary | ICD-10-CM | POA: Diagnosis not present

## 2014-11-08 DIAGNOSIS — W5503XA Scratched by cat, initial encounter: Secondary | ICD-10-CM | POA: Diagnosis not present

## 2014-11-08 DIAGNOSIS — Z8781 Personal history of (healed) traumatic fracture: Secondary | ICD-10-CM | POA: Insufficient documentation

## 2014-11-08 MED ORDER — DOXYCYCLINE HYCLATE 100 MG PO CAPS: 100.0000 mg | ORAL_CAPSULE | Freq: Two times a day (BID) | ORAL | Status: DC

## 2014-11-08 MED ORDER — TETANUS-DIPHTH-ACELL PERTUSSIS 5-2.5-18.5 LF-MCG/0.5 IM SUSP
0.5000 mL | Freq: Once | INTRAMUSCULAR | Status: AC
  Administered 2014-11-08: 0.5 mL via INTRAMUSCULAR
  Filled 2014-11-08: qty 0.5
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
## 2014-11-08 NOTE — Discharge Instructions (Signed)
You have been evaluated for a recent cat scratch/bite incident.  Take antibiotic to prevent infection.  Follow up with hand specialist if you notice signs of infection not improves with antibiotic. Return to ER if you have any concerns.    Cat Scratch Disease Cats often injure people by scratching or biting. This site of injury can become infected with a particular germ or bacteria present in the mouth of or on the cat. This germ is called Bartonella henselae. This infection is identified by the common name cat scratch disease (CSD).  SYMPTOMS  A red and sore pimple or bump, with or without pus, on the skin where the cat scratched or bit. The pimple or sore may be present for as long as three weeks after the scratch or bite occurred.  One or more enlarged lymph glands located toward the center of the body from where the injury occurred.  Less common symptoms include low-grade fever, tiredness, fatigue, headache and/or sore throat. DIAGNOSIS  The diagnosis is typically made by your caregiver who notes the history of a scratch or bite from a cat, and finds the skin sore and swollen lymph glands in the described area.  Culture of any drainage or pus from the injury site, or a needle aspiration or piece of tissue (biopsy) from a swollen lymph gland may also be done to confirm the diagnosis and assure that a different infection or disease is not causing your illness. Rare but serious complications may occur, they include:  Parinaud's syndrome - fever, swollen lymph glands and inflammation of the eye (conjunctivitis).  Infection of the brain (encephalitis).  Infection of the nerve of the eye (neuroretinitis).  Infection of the bone (osteomyelitis). TREATMENT  Usually treatment is not necessary or helpful, especially if you have a normal immune system. When infection is very severe, it may be treated with a medicine that kills the bacteria (antibiotic).  People with immune system problems (such  as having AIDS or an organ transplant, or being on steroids or other immune modifying drugs) should be treated with antibiotics. HOME CARE INSTRUCTIONS   Avoid injury while playing with cats.  Wash well after playing with cats.  Do not let your cat lick sores on your body.  Do not let your cat roam around outside of your house.  Keep the area of the cat scratch clean. Wash it with soap and water or apply an antiseptic solution such as povidone iodine.  You should get a tetanus shot if you have not had one in the past 5 or 10 years. If you receive one, your arm may get swollen and red and warm to the touch at the shot site. This is a common response to the medication in the shot. If you did not receive a tetanus shot here because you did not recall when your last one was given, make sure to check with your caregiver's office and determine if one is needed. Generally, for a "dirty" wound, you should receive a tetanus booster if you have not had one in the last five years. If you have a "clean" wound, you should receive a tetanus booster if you have not had one in the last ten years. SEEK IMMEDIATE MEDICAL CARE IF:   You have worsening signs of infection, such as more redness, increased pain, red streaking or pus coming from the wound, or warmth or swelling around the area of the scratch.  You develop worsening swollen lymph glands.  You develop abdominal pain, have  problems with your vision or develop a skin rash.  You have a fever.  You become more tired or dizzy, or have a worsening headache.  You develop inflammation of your eye or have increasing vision problems.  You have pain in one of your bones.  You develop a stiff neck.  You pass out. MAKE SURE YOU:   Understand these instructions.  Will watch your condition.  Will get help right away if you are not doing well or get worse. Document Released: 10/23/2000 Document Revised: 01/18/2012 Document Reviewed:  12/05/2008 [facility] Patient Information 2015 [place]. This information is not intended to replace advice given to you by your health care provider. Make sure you discuss any questions you have with your health care provider.

## 2014-11-08 NOTE — ED Notes (Addendum)
Pt states she was clawed and bitten by a cat today. Pt has several scratches and bite marks on her right hand, 3 small scratches on her left hand. Pt has lupus, states she has decreased immune system. Pt states she ran hand under water and used peroxide to clean wounds. Pt is unsure of tetanus immunization date.

## 2014-11-16 ENCOUNTER — Ambulatory Visit (INDEPENDENT_AMBULATORY_CARE_PROVIDER_SITE_OTHER): Payer: BLUE CROSS/BLUE SHIELD | Admitting: Physician Assistant

## 2014-11-16 VITALS — BP 116/64 | HR 72 | Temp 97.7°F | Resp 16 | Ht 67.5 in | Wt 180.2 lb

## 2014-11-16 DIAGNOSIS — B354 Tinea corporis: Secondary | ICD-10-CM

## 2014-11-16 DIAGNOSIS — D595 Paroxysmal nocturnal hemoglobinuria [Marchiafava-Micheli]: Secondary | ICD-10-CM

## 2014-11-16 HISTORY — DX: Paroxysmal nocturnal hemoglobinuria (Marchiafava-Micheli): D59.5

## 2014-11-16 MED ORDER — KETOCONAZOLE 2 % EX CREA: TOPICAL_CREAM | CUTANEOUS | Status: DC

## 2014-11-16 NOTE — Progress Notes (Signed)
Subjective:    Patient ID: Diane Moody, female    DOB: 1961/04/07, 54 y.o.   MRN: [id number]  HPI  This is a 54 year old female with PMH lupus on immunosuppressants, paroxysmal nocturnal hemoglobinuria and myelodysplastic syndrome who is presenting with a rash x 9 days. She thought she may have been bit by a spider. The lesion is located to her left calf. It is red and circular. It was pruritic initially but this has resolved. 1 day after the lesion appeared she got bit by cat on her right hand and was put on doxycycline. Yesterday she went to a friend's appt at a wound care clinic and asked the doctor what he thought. He told her it could be fungus and she should be seen. She was initially putting antibacterial ointment on the lesion without any improvement. She has used some of her son's ketoconazole yesterday and today and feels it has improved some. She has never had a lesion like this before. She denies fever or chills. Other than doxy, no other recent medication changes.  Review of Systems  Constitutional: Negative for fever and chills.  Musculoskeletal: Negative for myalgias.  Skin: Positive for rash.    Patient Active Problem List   Diagnosis Date Noted  . Paroxysmal nocturnal hemoglobinuria 11/16/2014  . Immunosuppressed status 10/17/2013  . Lupus 10/17/2013   Prior to Admission medications   Medication Sig Start Date End Date Taking? Authorizing Provider  albuterol (PROVENTIL HFA;VENTOLIN HFA) 108 (90 BASE) MCG/ACT inhaler Inhale 2 puffs into the lungs every 4 (four) hours as needed for wheezing or shortness of breath. For shortness of breath 10/17/13  Yes Sherren Mocha, MD  amphetamine-dextroamphetamine (ADDERALL) 20 MG tablet Take 20 mg by mouth daily.   Yes Historical Provider, MD  bisacodyl (BISACODYL) 5 MG EC tablet Take 5 mg by mouth daily as needed. For constipation   Yes Historical Provider, MD  buPROPion (WELLBUTRIN XL) 150 MG 24 hr tablet Take 150 mg by mouth daily.     Yes Historical Provider, MD  Calcium Carbonate-Vitamin D (CALCIUM + D PO) Take 1 tablet by mouth daily.    Yes Historical Provider, MD  cetirizine (ZYRTEC) 10 MG tablet Take 10 mg by mouth daily.   Yes Historical Provider, MD  desloratadine (CLARINEX) 5 MG tablet Take 5 mg by mouth daily.   Yes Historical Provider, MD  diclofenac sodium (VOLTAREN) 1 % GEL Apply topically as needed.   Yes Historical Provider, MD  doxycycline (VIBRAMYCIN) 100 MG capsule Take 1 capsule (100 mg total) by mouth 2 (two) times daily. 11/08/14  Yes Fayrene Helper, PA-C  DULoxetine (CYMBALTA) 30 MG capsule Take 60 mg by mouth daily.  02/18/14  Yes Historical Provider, MD  Eculizumab (SOLIRIS IV) Inject 900 mg into the vein every 14 (fourteen) days.   Yes Historical Provider, MD  EPINEPHrine (EPIPEN JR) 0.15 MG/0.3ML injection Inject 0.15 mg into the muscle as needed.   Yes Historical Provider, MD  hydroxychloroquine (PLAQUENIL) 200 MG tablet Take 400 mg by mouth daily.    Yes Historical Provider, MD  hydrOXYzine (ATARAX/VISTARIL) 25 MG tablet Take 25 mg by mouth 3 (three) times daily as needed.   Yes Historical Provider, MD  IRON-B12-VITAMINS IM Inject 1 mg into the muscle every 30 (thirty) days.   Yes Historical Provider, MD  Gold[place] [address] 15 GM/60ML suspension 15 g.  01/31/14  Yes Historical Provider, MD  L-Methylfolate-Algae (DEPLIN 15) 15-90.314 MG CAPS Take 1 capsule by mouth daily.  02/23/14  Yes Historical Provider, MD  Lactobacillus Bifidus CAPS Take 1 capsule by mouth daily.    Yes Historical Provider, MD  LORazepam (ATIVAN) 1 MG tablet Take 1 mg by mouth every 6 (six) hours as needed. For anxiety   Yes Historical Provider, MD  loteprednol (LOTEMAX) 0.5 % ophthalmic suspension Place 1 drop into both eyes 4 (four) times daily.   Yes Historical Provider, MD  medroxyPROGESTERone (PROVERA) 10 MG tablet Take 1 tablet (10 mg total) by mouth daily. 07/30/14  Yes Lauro FranklinPatricia Rolen-Grubb, FNP  Misc Natural Products (WHITE WILLOW BARK PO)  Take by mouth daily.   Yes Historical Provider, MD  mometasone (NASONEX) 50 MCG/ACT nasal spray Place 2 sprays into the nose daily.   Yes Historical Provider, MD  montelukast (SINGULAIR) 10 MG tablet Take 10 mg by mouth at bedtime.   Yes Historical Provider, MD  mupirocin ointment (BACTROBAN) 2 % Apply topically 3 (three) times daily.   Yes Historical Provider, MD  Omega-3 Fatty Acids (FISH OIL) 500 MG CAPS Take by mouth daily.   Yes Historical Provider, MD  OXISTAT 1 % CREA daily.  01/22/14  Yes Historical Provider, MD  PARoxetine (PAXIL) 20 MG tablet Take 20 mg by mouth every morning.   Yes Historical Provider, MD  predniSONE (DELTASONE) 5 MG tablet Take 2.5 mg by mouth daily. Take with prednisone 1mg    Yes Historical Provider, MD  ZANAFLEX 4 MG tablet Take 4 mg by mouth at bedtime.  02/18/14  Yes Historical Provider, MD  zolpidem (AMBIEN) 10 MG tablet Take 10 mg by mouth at bedtime as needed.   Yes Historical Provider, MD   Allergies  Allergen Reactions  . Augmentin [Amoxicillin-Pot Clavulanate] Rash  . Celebrex [Celecoxib] Rash    Has a sulfa component.  . Nyquil [Pseudoeph-Doxylamine-Dm-Apap] Rash  . Sulfa Antibiotics Rash   Patient's social and family history were reviewed.     Objective:   Physical Exam  Constitutional: She is oriented to person, place, and time. She appears well-developed and well-nourished. No distress.  HENT:  Head: Normocephalic and atraumatic.  Right Ear: Hearing normal.  Left Ear: Hearing normal.  Nose: Nose normal.  Eyes: Conjunctivae and lids are normal. Right eye exhibits no discharge. Left eye exhibits no discharge. No scleral icterus.  Cardiovascular: Normal rate, regular rhythm, normal heart sounds, intact distal pulses and normal pulses.   No murmur heard. Pulmonary/Chest: Effort normal and breath sounds normal. No respiratory distress. She has no wheezes. She has no rhonchi. She has no rales.  Musculoskeletal: Normal range of motion.    Neurological: She is alert and oriented to person, place, and time.  Skin: Skin is warm and dry. Lesion noted.  3x3 cm beefy red annular lesion with scaly border and small satellite lesion on left calf  Psychiatric: She has a normal mood and affect. Her speech is normal and behavior is normal. Thought content normal.      Assessment & Plan:  1. Tinea corporis Pt will use ketoconazole TID until fungal infection has been completely resolved for 5 days. She will return if symptoms fail to improve.  - ketoconazole (NIZORAL) 2 % cream; Apply to affected area 3 times a day - continue for 5 days after rash has completely resolved.  Dispense: 15 g; Refill: 0   Ambera Fedele V. Dyke BrackettBush, PA-C, M[facility]  11/16/2014

## 2014-11-16 NOTE — Patient Instructions (Signed)
Apply cream to area three times a day until rash has completely resolved for 5 days. Return if the area does not resolve.

## 2014-11-29 ENCOUNTER — Telehealth: Payer: Self-pay | Admitting: Nurse Practitioner

## 2014-11-29 NOTE — Telephone Encounter (Signed)
OK to wait and get labs at same time

## 2014-11-29 NOTE — Telephone Encounter (Signed)
Spoke with patient. Patient states that she took Provera in 07/2014 with no bleeding. States she was scheduled to go to the endocrinologist and was going to have labs drawn but had to reschedule the appointment. Appointment if rescheduled to 02/2015. Patient has not had a cycle since March of 2015. Patient has aex with Lauro FranklinPatricia Rolen-Grubb, FNP 03/01/2015. Patient would like to know if she can wait until that appointment to speak with Patty or if she needs to come in sooner for labs. Advised will speak with Lauro FranklinPatricia Rolen-Grubb, FNP and return call with further recommendations. Patient is agreeable.  Per last phone note patient needed FSH level drawn. Okay for patient to wait until annual or would you like me to schedule her for lab appointment earlier?

## 2014-11-29 NOTE — Telephone Encounter (Signed)
Left message to call Nyima Vanacker at [phone]. 

## 2014-11-29 NOTE — Telephone Encounter (Signed)
Pt has not had a cycle in three months and would like to talk with the nurse.

## 2014-11-30 ENCOUNTER — Other Ambulatory Visit: Payer: Self-pay | Admitting: Physician Assistant

## 2014-11-30 ENCOUNTER — Telehealth: Payer: Self-pay | Admitting: Family Medicine

## 2014-11-30 DIAGNOSIS — B354 Tinea corporis: Secondary | ICD-10-CM

## 2014-11-30 MED ORDER — KETOCONAZOLE 2 % EX CREA: TOPICAL_CREAM | CUTANEOUS | Status: DC

## 2014-11-30 NOTE — Telephone Encounter (Signed)
Received a call that she needed a RF of her nizoral for her ring worm- I will refill this for her  Meds ordered this encounter  Medications  . ketoconazole (NIZORAL) 2 % cream    Sig: Apply to affected area 3 times a day - continue for 5 days after rash has completely resolved.    Dispense:  15 g    Refill:  0

## 2014-12-03 NOTE — Telephone Encounter (Signed)
Please see telephone call from 11/29/14. Spoke with patient again 12/03/14 in regards to recommendations from Lauro FranklinPatricia Rolen-Grubb, FNP.  Routing to provider for final review. Patient agreeable to disposition. Will close encounter

## 2014-12-03 NOTE — Telephone Encounter (Signed)
Spoke with patient. Advised patient of message as seen below from Lauro FranklinPatricia Rolen-Grubb, FNP. Agreeable and will wait until aex in April for labs. Will call office if she needs anything before then.  Routing to provider for final review. Patient agreeable to disposition. Will close encounter

## 2014-12-03 NOTE — Telephone Encounter (Signed)
Left message to call Deborha Moseley at [phone]. 

## 2014-12-03 NOTE — Telephone Encounter (Signed)
Returning call.

## 2014-12-22 ENCOUNTER — Other Ambulatory Visit: Payer: Self-pay | Admitting: Physician Assistant

## 2014-12-22 ENCOUNTER — Telehealth: Payer: Self-pay

## 2014-12-22 NOTE — Telephone Encounter (Signed)
Patient has been using a cream for ring worm and is almost out. [facility] [address] Patient also wants to know if it'll hurt is she goes a few days without the cream until the prescription is filled. Please call! [phone]

## 2014-12-25 ENCOUNTER — Ambulatory Visit (INDEPENDENT_AMBULATORY_CARE_PROVIDER_SITE_OTHER): Payer: BLUE CROSS/BLUE SHIELD | Admitting: Physician Assistant

## 2014-12-25 VITALS — BP 106/69 | HR 80 | Temp 98.4°F | Resp 18 | Wt 181.0 lb

## 2014-12-25 DIAGNOSIS — R21 Rash and other nonspecific skin eruption: Secondary | ICD-10-CM

## 2014-12-25 DIAGNOSIS — B354 Tinea corporis: Secondary | ICD-10-CM

## 2014-12-25 MED ORDER — KETOCONAZOLE 2 % EX CREA: TOPICAL_CREAM | CUTANEOUS | Status: DC

## 2014-12-25 NOTE — Patient Instructions (Signed)
Continue with ketoconazole three times a day. Will call you with results of your skin biopsy.

## 2014-12-25 NOTE — Telephone Encounter (Signed)
Called pt. She will RTC this afternoon.

## 2014-12-25 NOTE — Telephone Encounter (Signed)
Diane Reiningicole, do you want to RF this or does pt need to RTC for re-eval?

## 2014-12-25 NOTE — Progress Notes (Signed)
Subjective:    Patient ID: Diane Moody, female    DOB: September 16, 1961, 54 y.o.   MRN: [id number]  HPI  This is a 54 year old female with PMH lupus on immunosuppressants, myelodysplastic syndrome, and PND who is presenting for follow up rash. 1.5 months ago she was seen for an annular, scaly rash on her left calf. She was dx'd with tinea corporis and treated with ketoconazole. Earlier today she called asking for a refill of the ketoconazole. I asked her to come in to be seen instead. She states the ketoconazole was helping but the rash never went away. One week ago she lost her fungal cream and went without for 4 days. She states the rash started to worsen again. She then found the cream and started using and feels it is getting better again. The area is not pruritic or painful. She reports she has flaky skin on her left big toe and sole of foot that has been there for 17 years. She has used topical and oral antifungals and nothing has worked. She recently saw her dermatologist who took a biopsy of the area on her foot. She is awaiting results. Pt has not been tried on any new medications recently. She continues to get infusions for PND. No recent lupus flares. She is getting outpatient surgery in two days for a dupuytren's contracture.  Review of Systems  Constitutional: Negative for fever and chills.  Gastrointestinal: Negative for nausea, vomiting and abdominal pain.  Musculoskeletal: Positive for arthralgias.  Skin: Positive for rash.  Allergic/Immunologic: Positive for immunocompromised state.  Neurological: Negative for numbness.  Hematological: Negative for adenopathy.   Patient Active Problem List   Diagnosis Date Noted  . Paroxysmal nocturnal hemoglobinuria 11/16/2014  . Immunosuppressed status 10/17/2013  . Lupus 10/17/2013   Prior to Admission medications   Medication Sig Start Date End Date Taking? Authorizing Provider  albuterol (PROVENTIL HFA;VENTOLIN HFA) 108 (90 BASE) MCG/ACT  inhaler Inhale 2 puffs into the lungs every 4 (four) hours as needed for wheezing or shortness of breath. For shortness of breath 10/17/13  Yes Sherren Mocha, MD  amphetamine-dextroamphetamine (ADDERALL) 20 MG tablet Take 20 mg by mouth daily.   Yes Historical Provider, MD  bisacodyl (BISACODYL) 5 MG EC tablet Take 5 mg by mouth daily as needed. For constipation   Yes Historical Provider, MD  Calcium Carbonate-Vitamin D (CALCIUM + D PO) Take 1 tablet by mouth daily.    Yes Historical Provider, MD  cetirizine (ZYRTEC) 10 MG tablet Take 10 mg by mouth daily.   Yes Historical Provider, MD  desloratadine (CLARINEX) 5 MG tablet Take 5 mg by mouth daily.   Yes Historical Provider, MD  diclofenac sodium (VOLTAREN) 1 % GEL Apply topically as needed.   Yes Historical Provider, MD  DULoxetine (CYMBALTA) 30 MG capsule Take 60 mg by mouth daily.  02/18/14  Yes Historical Provider, MD  Eculizumab (SOLIRIS IV) Inject 900 mg into the vein every 14 (fourteen) days.   Yes Historical Provider, MD  EPINEPHrine (EPIPEN JR) 0.15 MG/0.3ML injection Inject 0.15 mg into the muscle as needed.   Yes Historical Provider, MD  hydroxychloroquine (PLAQUENIL) 200 MG tablet Take 400 mg by mouth daily.    Yes Historical Provider, MD  IRON-B12-VITAMINS IM Inject 1 mg into the muscle every 30 (thirty) days.   Yes Historical Provider, MD  ketoconazole (NIZORAL) 2 % cream Apply to affected area 3 times a day - continue for 5 days after rash has completely resolved.  12/25/14  Yes Dorna Leitz, PA-C  KIONEX 15 GM/60ML suspension 15 g.  01/31/14  Yes Historical Provider, MD  L-Methylfolate-Algae (DEPLIN 15) 15-90.314 MG CAPS Take 1 capsule by mouth daily.  02/23/14  Yes Historical Provider, MD  LORazepam (ATIVAN) 1 MG tablet Take 1 mg by mouth every 6 (six) hours as needed. For anxiety   Yes Historical Provider, MD  loteprednol (LOTEMAX) 0.5 % ophthalmic suspension Place 1 drop into both eyes 4 (four) times daily.   Yes Historical Provider, MD    Misc Natural Products (WHITE WILLOW BARK PO) Take by mouth daily.   Yes Historical Provider, MD  montelukast (SINGULAIR) 10 MG tablet Take 10 mg by mouth at bedtime.   Yes Historical Provider, MD  mupirocin ointment (BACTROBAN) 2 % Apply topically 3 (three) times daily.   Yes Historical Provider, MD  Omega-3 Fatty Acids (FISH OIL) 500 MG CAPS Take by mouth daily.   Yes Historical Provider, MD  PARoxetine (PAXIL) 20 MG tablet Take 20 mg by mouth every morning.   Yes Historical Provider, MD  predniSONE (DELTASONE) 5 MG tablet Take 2.5 mg by mouth daily. Take with prednisone    Yes Historical Provider, MD  ZANAFLEX 4 MG tablet Take 4 mg by mouth at bedtime.  02/18/14  Yes Historical Provider, MD  zolpidem (AMBIEN) 10 MG tablet Take 10 mg by mouth at bedtime as needed.   Yes Historical Provider, MD   Allergies  Allergen Reactions  . Augmentin [Amoxicillin-Pot Clavulanate] Rash  . Celebrex [Celecoxib] Rash    Has a sulfa component.  . Nyquil [Pseudoeph-Doxylamine-Dm-Apap] Rash  . Sulfa Antibiotics Rash   Patient's social and family history were reviewed.     Objective:   Physical Exam  Constitutional: She is oriented to person, place, and time. She appears well-developed and well-nourished. No distress.  HENT:  Head: Normocephalic and atraumatic.  Right Ear: Hearing normal.  Left Ear: Hearing normal.  Nose: Nose normal.  Eyes: Conjunctivae and lids are normal. Right eye exhibits no discharge. Left eye exhibits no discharge. No scleral icterus.  Pulmonary/Chest: Effort normal. No respiratory distress.  Musculoskeletal: Normal range of motion.  Neurological: She is alert and oriented to person, place, and time.  Skin: Skin is warm, dry and intact. No lesion and no rash noted.  4x4 cm annular lesion with a separate 1x1 cm lesion medially. Skin is purple/brown discolored without scale. Skin is atrophic. Left big toe and sole of foot slightly erythematous with some scale. Bandage in place  over biopsied area.  Psychiatric: She has a normal mood and affect. Her speech is normal and behavior is normal. Thought content normal.   BP 106/69 mmHg  Pulse 80  Temp(Src) 98.4 F (36.9 C) (Oral)  Resp 18  Wt 181 lb (82.101 kg)  SpO2 99%  KOH only revealed skin cells - interpreted by Dr. Cleta Alberts  Procedure: Verbal consent obtained. Skin cleaned with alcohol and anesthetized with 2 cc 1% lido with epi. A 3 mm punch biopsy obtained for lateral border of lesion. Wound dressed and wound care discussed.     Assessment & Plan:  1. Tinea corporis 2. Rash Cause of rash unknown. Could be tinea corporis that has been treated. Could be a skin manifestation of lupus. Biopsy taken. She will continue with ketoconazole while awaiting the biopsy results as she feels it has been helpful.   - ketoconazole (NIZORAL) 2 % cream; Apply to affected area 3 times a day - continue for 5 days  after rash has completely resolved.  Dispense: 15 g; Refill:  - Dermatology pathology - POCT Skin KOH   Roswell MinersNicole V. Dyke BrackettBush, PA-C, MHS [facility]  12/26/2014

## 2014-12-25 NOTE — Telephone Encounter (Signed)
See also RF enc.

## 2014-12-26 ENCOUNTER — Encounter: Payer: Self-pay | Admitting: Physician Assistant

## 2015-01-03 ENCOUNTER — Encounter: Payer: Self-pay | Admitting: Physician Assistant

## 2015-01-11 ENCOUNTER — Telehealth: Payer: Self-pay | Admitting: Family Medicine

## 2015-01-11 DIAGNOSIS — L739 Follicular disorder, unspecified: Secondary | ICD-10-CM

## 2015-01-11 NOTE — Telephone Encounter (Signed)
Left message with female for patient to give us a call back patent also had a culture done and i got Dr Conley RollsLe to look at results and she states that it showed that patient has fungus folliculitis and the antibiotic that shes on should help

## 2015-01-14 ENCOUNTER — Encounter: Payer: Self-pay | Admitting: Physician Assistant

## 2015-01-14 NOTE — Telephone Encounter (Signed)
Called pt to discuss results, left VM to call back 

## 2015-01-16 ENCOUNTER — Encounter: Payer: Self-pay | Admitting: Physician Assistant

## 2015-01-16 MED ORDER — FLUCONAZOLE 150 MG PO TABS: ORAL_TABLET | ORAL | Status: DC

## 2015-01-16 NOTE — Telephone Encounter (Signed)
Pt had a bad reaction to lamisil several years ago. She states she has taken diflucan several times since without problems. Diflucan 150 mg once weekly x 4 weeks prescribed for fungal folliculitis. She will stop the ketoconazole cream.

## 2015-02-20 ENCOUNTER — Other Ambulatory Visit: Payer: Self-pay | Admitting: Physician Assistant

## 2015-02-20 DIAGNOSIS — L739 Follicular disorder, unspecified: Secondary | ICD-10-CM

## 2015-02-21 NOTE — Telephone Encounter (Signed)
Diane Reiningicole, do you want to RF this for pt, or RTC?

## 2015-03-01 ENCOUNTER — Encounter: Payer: Self-pay | Admitting: Nurse Practitioner

## 2015-03-01 ENCOUNTER — Ambulatory Visit (INDEPENDENT_AMBULATORY_CARE_PROVIDER_SITE_OTHER): Payer: BLUE CROSS/BLUE SHIELD | Admitting: Nurse Practitioner

## 2015-03-01 VITALS — BP 130/78 | HR 76 | Ht 66.75 in | Wt 183.4 lb

## 2015-03-01 DIAGNOSIS — Z0000 Encounter for general adult medical examination without abnormal findings: Secondary | ICD-10-CM | POA: Diagnosis not present

## 2015-03-01 DIAGNOSIS — Z01419 Encounter for gynecological examination (general) (routine) without abnormal findings: Secondary | ICD-10-CM

## 2015-03-01 DIAGNOSIS — N912 Amenorrhea, unspecified: Secondary | ICD-10-CM

## 2015-03-01 LAB — POCT URINALYSIS DIPSTICK
Leukocytes, UA: NEGATIVE
Urobilinogen, UA: NEGATIVE
pH, UA: 5

## 2015-03-01 NOTE — Patient Instructions (Signed)
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
## 2015-03-02 LAB — FOLLICLE STIMULATING HORMONE: FSH: 60.9 m[IU]/mL

## 2015-03-03 NOTE — Progress Notes (Signed)
Encounter reviewed by Dr. Jennel Mara Silva.  

## 2015-03-04 ENCOUNTER — Encounter: Payer: Self-pay | Admitting: Nurse Practitioner

## 2015-03-04 NOTE — Telephone Encounter (Signed)
Patty, I have added patient's PCP to her chart.   Can you address the other parts of the message?

## 2015-03-05 NOTE — Telephone Encounter (Signed)
Spoke with Dr.Mann's office. Last colonoscopy was performed on 08/17/2011. They will fax report to the office. Patient is due for next colonoscopy on 08/09/2016. Advised will let Lauro FranklinPatricia Rolen-Grubb, FNP and the patient know. Also need to advised of lab results sent via mychart as seen below.  Notes Recorded by Roanna BanningPatricia R Grubb, FNP on 03/04/2015 at 1:10 PM Results via my chart:  Tresa EndoKelly, [facility] - hormone test is higher that 5 months ago at 60.9. More in the menopausal range - not sure if you will have another menses or not - but if you do have a cycle we need to know to make of note of this. If any further problems let us know. The hot flashes should be Ok and tolerable - but if not then call us.  Left message to call Buffie Herne at [phone].

## 2015-03-05 NOTE — Telephone Encounter (Signed)
Spoke with patient. Advised of results and message from Dr.Mann's office as seen below. Patient is agreeable and verbalizes understanding. States she has been having a flare up with her hemorrhoid and having constipation. When she used the restroom this morning strained to have BM and noticed some blood on tissue. Advised patient could be from hemorrhoid and constipation causing the light bleeding. Encouraged to take a daily stool softener. Advised to monitor and will need to seek care with GI or PCP if this continues or worsens. Patient is agreeable.  Routing to provider for final review. Patient agreeable to disposition. Will close encounter

## 2015-03-06 LAB — IPS PAP TEST WITH HPV

## 2015-04-23 ENCOUNTER — Telehealth: Payer: Self-pay | Admitting: Nurse Practitioner

## 2015-04-23 NOTE — Telephone Encounter (Signed)
Please let pt. Know that BMD done 03/22/15 shows a normal score for spine and bilateral hips.  The lowest T Score was the right hip at 0.9.  She has done a good job with exercise, calcium, and Vit D.  Keep up the good work.  Repeat again about age 54 - 22.

## 2015-04-25 NOTE — Telephone Encounter (Signed)
Pt notified of results.  Pt voices understanding and is agreeable with plan to repeat at age 54-60.

## 2015-07-02 ENCOUNTER — Telehealth: Payer: Self-pay | Admitting: Nurse Practitioner

## 2015-07-02 DIAGNOSIS — N950 Postmenopausal bleeding: Secondary | ICD-10-CM

## 2015-07-02 NOTE — Telephone Encounter (Signed)
Spoke with patient. Patient states that she woke up yesterday morning and was experiencing light spotting. States she is still experiencing spotting today. Is wearing a regular tampon. "I am not bleeding through it or anything but it is light bleeding." Had light cramping last night. Denies any cramping today. LMP was April 2015. Advised will need to be seen in office for further evaluation in office. Patient is agreeable. Declines appointment for tomorrow. Appointment scheduled for 8/25 at 4 pm with Dr.Jertson. Patient is agreeable to date and time. Discussed the possibility of EMB at appointment. Advised to take 800 mg of ibuprofen/motrin one hour before in case EMB is performed at appointment. Patient is agreeable. Order placed for precert in case this is performed.  Cc: Braxton Feathers  Routing to provider for final review. Patient agreeable to disposition. Will close encounter.   Patient aware provider will review message and nurse will return call if any additional advice or change of disposition.

## 2015-07-02 NOTE — Telephone Encounter (Signed)
Patient calling to report she is having a menstrual cycle after not having one for at least a year. Okay to leave a message so she can call back--per patient.

## 2015-07-02 NOTE — Telephone Encounter (Signed)
Left message to call Chastidy Ranker at [phone]. 

## 2015-07-03 ENCOUNTER — Telehealth: Payer: Self-pay | Admitting: Obstetrics and Gynecology

## 2015-07-03 ENCOUNTER — Ambulatory Visit (INDEPENDENT_AMBULATORY_CARE_PROVIDER_SITE_OTHER): Payer: BLUE CROSS/BLUE SHIELD | Admitting: Obstetrics and Gynecology

## 2015-07-03 ENCOUNTER — Encounter: Payer: Self-pay | Admitting: Obstetrics and Gynecology

## 2015-07-03 VITALS — BP 128/68 | HR 78 | Resp 16 | Wt 177.0 lb

## 2015-07-03 DIAGNOSIS — N950 Postmenopausal bleeding: Secondary | ICD-10-CM

## 2015-07-03 MED ORDER — MISOPROSTOL 200 MCG PO TABS: ORAL_TABLET | ORAL | Status: DC

## 2015-07-03 NOTE — Telephone Encounter (Signed)
Spoke with patient. Patient states that she woke up this morning and her bleeding has increased. "I feel like it is more of a normal period now. I am also having a lot of clots." Please see telephone encounter dated 07/02/2015. Patient began bleeding on 8/22 and was having light spotting that had now increased. LMP was 02/2014. Has appointment scheduled for 07/04/2015 at 4 pm with Dr.Jerston. Advised will need to be seen earlier for further evaluation. Patient is agreeable. Appointment scheduled for today 07/03/2015 at 12:45 pm with Dr.Jertson. Patient is agreeable to date and time.  Routing to provider for final review. Patient agreeable to disposition. Will close encounter.   Patient aware provider will review message and nurse will return call if any additional advice or change of disposition.

## 2015-07-03 NOTE — Patient Instructions (Signed)
Call if you are saturating more than 1 pad an hour.

## 2015-07-03 NOTE — Telephone Encounter (Signed)
Patient's says her period has gotten heavier with clots. Chart to triage.
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
## 2015-07-04 ENCOUNTER — Telehealth: Payer: Self-pay | Admitting: Obstetrics and Gynecology

## 2015-07-04 ENCOUNTER — Ambulatory Visit: Payer: BLUE CROSS/BLUE SHIELD | Admitting: Obstetrics and Gynecology

## 2015-07-04 DIAGNOSIS — N950 Postmenopausal bleeding: Secondary | ICD-10-CM

## 2015-07-04 NOTE — Telephone Encounter (Signed)
Call to patient to review benefits for procedure. Spoke with patient and she is agreeable. Routing to triage to call to schedule.

## 2015-07-04 NOTE — Telephone Encounter (Signed)
Left message to call Yelena Metzer at [phone]. 

## 2015-07-05 ENCOUNTER — Telehealth: Payer: Self-pay | Admitting: Emergency Medicine

## 2015-07-05 NOTE — Telephone Encounter (Signed)
Patient has had endometrial biopsy in office on 07/03/15.  Per Dr. Salli Quarry notes, needs Pelvic ultrasound or possible Sonohysterogram in office, pre-treated with cytotec.   Order placed for Sonohysterogram for pre-cert.

## 2015-07-05 NOTE — Telephone Encounter (Deleted)
-----   Message from Jill Evelyn Jertson, MD sent at 07/05/2015  9:22 AM EDT ----- Can you please check with pathology as to if the benign endometrial tissue showed signs of hormonal stimulation or atrophy (ie proliferative endometrium?), the patient is PMP.  I've sent the patient a message that the biopsy was benign.  Thanks 

## 2015-07-05 NOTE — Telephone Encounter (Signed)
-----   Message from [name], MD sent at 07/05/2015  9:22 AM EDT ----- Can you please check with pathology as to if the benign endometrial tissue showed signs of hormonal stimulation or atrophy (ie proliferative endometrium?), the patient is PMP.  I've sent the patient a message that the biopsy was benign.  Thanks

## 2015-07-11 NOTE — Telephone Encounter (Signed)
Left message to call Eleazar Kimmey at [phone]. 

## 2015-07-17 NOTE — Telephone Encounter (Signed)
Patient calling to schedule sonohysterogram. Ok to leave detailed message with possible dates.

## 2015-07-17 NOTE — Telephone Encounter (Signed)
Notes Recorded by Joeseph Amor, RN on 07/05/2015 at 10:56 AM Talked with Dr. Raynald Blend at [facility], he states there are no signs of hormonal stimulation and no good evidence of progesterone effect.

## 2015-07-17 NOTE — Telephone Encounter (Signed)
Spoke with patient. Appointment scheduled please see encounter from 07/17/2015.  Routing to provider for final review. Patient agreeable to disposition. Will close encounter.

## 2015-07-17 NOTE — Telephone Encounter (Signed)
Spoke with patient. Patient would like to schedule PUS/possible SHGM at this time. Appointment scheduled for 9/13 at 2 pm with 2:30 pm consult with Dr.Jertson. Patient is agreeable and verbalizes understanding. Cytotec instructions given. Will need to place 2 tablets (400 mcg) pv 6-12 hours prior to appointment. Patient is agreeable. Patient is aware of benefits.  Routing to provider for final review. Patient agreeable to disposition. Will close encounter.

## 2015-07-18 ENCOUNTER — Telehealth: Payer: Self-pay | Admitting: Obstetrics and Gynecology

## 2015-07-18 NOTE — Telephone Encounter (Signed)
Called patient to review benefits for procedure. Left voicemail to call back and review. °

## 2015-07-23 ENCOUNTER — Encounter: Payer: Self-pay | Admitting: Obstetrics and Gynecology

## 2015-07-23 ENCOUNTER — Ambulatory Visit (INDEPENDENT_AMBULATORY_CARE_PROVIDER_SITE_OTHER): Payer: BLUE CROSS/BLUE SHIELD | Admitting: Obstetrics and Gynecology

## 2015-07-23 ENCOUNTER — Other Ambulatory Visit: Payer: Self-pay | Admitting: Obstetrics and Gynecology

## 2015-07-23 ENCOUNTER — Ambulatory Visit (INDEPENDENT_AMBULATORY_CARE_PROVIDER_SITE_OTHER): Payer: BLUE CROSS/BLUE SHIELD

## 2015-07-23 VITALS — BP 112/60 | HR 68 | Resp 14 | Wt 177.0 lb

## 2015-07-23 DIAGNOSIS — N950 Postmenopausal bleeding: Secondary | ICD-10-CM

## 2015-07-23 NOTE — Progress Notes (Signed)
     S: the patient was seen for PMP bleeding, biopsy was benign. U/s was limited, stripe was 7.5 mm (difficult to visualize). Prior to last month, her prior bleed was a year ago. The patient's sister died a year ago of an overdose. She had a h/o breast cancer at 23, ? Ovarian cysts. Doesn't think she had BRCA testing. She was wondering about further testing.  She has multiple c/o, she has ongoing bowel issues, she has had a colonoscopy x 2. She is currently c/o a hemorrhoid, for the last 4 days, tender, but less tender. Getting better. She will check with GI for when she is due for f/u.    Review of Systems  Constitutional: Negative.   HENT: Negative.   Eyes: Negative.   Respiratory: Negative.   Cardiovascular: Negative.   Gastrointestinal:       Bloating, constipation, diarrhea, hemorrhoid   Genitourinary:       Breast pain   Musculoskeletal: Positive for myalgias.  Skin: Positive for itching.  Neurological: Negative.   Endo/Heme/Allergies:       Heat and cold intolerance   Psychiatric/Behavioral: Positive for depression and memory loss.   Most of her symptoms she atributes to her Lupus and her PNH  O: General: alert white female in NAD Blood pressure 112/60, pulse 68, resp. rate 14, weight 177 lb (80.287 kg). Pelvic: normal external genitalia, normal vaginal mucosa, cervix without lesions. Anus: hemorrhoid noted at 4 o'clock, not thrombosed.  Sonohysterogram The procedure and risks of the procedure were reviewed with the patient, consent form was signed. A speculum was placed in the vagina and the cervix was cleansed with betadine. A tenaculum was placed and the sonohysterogram catheter was inserted into the uterine cavity without difficulty. The tenaculum and speculum were removed. Saline was infused under direct observation with the ultrasound. No intracavitary defects were noted, but the visualization was limited secondary to uterine position. Multiple attempts were made to  adjust the ultrasound probe and push on the patients lower abdomen, none of which improved visualization.The catheter was removed.    Assessment:  PMP bleeding, benign biopsy. Limited visualization on ultrasound and sonohysterogram. The lining was thickened at 7.5 mm, no obvious defects on sonohysterogram, but not well visualized.  Hemorrhoid, improving, has suppositories  Family history of breast cancer, given her sisters age and no other relatives with breast cancer, would not recommend BRCA testing at this time  Plan:   Discussed possible option of watching for further bleeding or proceeding with hysteroscopy, D&C. The safer option being proceeding with hysteroscopy, D&C. Explained the procedure, she would like to schedule surgery  In addition to the sonohysterogram, I spent over 15 minutes face to face with the patient, over 50% in counselling

## 2015-07-25 ENCOUNTER — Telehealth: Payer: Self-pay | Admitting: Obstetrics and Gynecology

## 2015-07-25 NOTE — Telephone Encounter (Signed)
Called patient to review benefits for surgery. Left voicemail to call back and review. °

## 2015-07-26 NOTE — Telephone Encounter (Signed)
2nd attempt: Called patient to review benefits for surgery. Left voicemail to call back and review.

## 2015-08-01 ENCOUNTER — Encounter: Payer: Self-pay | Admitting: Emergency Medicine

## 2015-08-01 NOTE — Telephone Encounter (Signed)
Returned call to patient. Discussed dates with patient. She is agreeable to 08/12/15 at 0900.  She will speak with her husband and confirm that this is a good date/time and will return call.

## 2015-08-01 NOTE — Telephone Encounter (Signed)
Patient returned call. Aware of benefit for surgery. Patient aware of cancellation fee. Patient agreeable to transfer to [facility] to review scheduling. Diane Moody unavailable currently and patient agreeable to return call for scheduling.  Patient states she has a Dr appt at 10.

## 2015-08-01 NOTE — Telephone Encounter (Signed)
Patient calling to schedule procedure best number to reach is her cell [phone].

## 2015-08-01 NOTE — Telephone Encounter (Signed)
Dr. Oscar La, patient has PCP appointment for clearance on 08/08/15. Okay as scheduled for PCP appointment after pre-op?    Spoke with patient and she confirms date 08/12/15 at 0900 for her procedure and is scheduled at [facility].   Beginning 14 days before surgery, we ask that you do not take the following medications:  Aspirin, Vitamin E, NSAIDS (like Advil, Aleve, Motrin, Ibuprofen), Fish Oil, Herbal supplements. Long discussion with patient about her herbal supplement usage, advised if not prescribed by a doctor, do not use. She verbalized understanding.  You can take Tylenol or acetaminophen for any discomfort.  Do not take any other pain medications unless approved by your physician.  You may continue your regular multi-vitamin. No bowel prep needed.  DO NOT EAT OR DRINK ANYTHING AFTER MIDNIGHT ON THE NIGHT BEFORE YOUR SURGERY (INCLUDING WATER) UNLESS ADVISED TO DO SO BY THE HOSPITAL OR YOUR PHYSICIAN.  Dress casually the morning of surgery.  Do not take any valuables with you and do not wear makeup, jewelry or lotion.  You should not shave 48 hours prior to surgery.  Pre op appointment scheduled for 08/07/15 0830  2 week post op appointment scheduled for 08/26/15 1430

## 2015-08-01 NOTE — Telephone Encounter (Signed)
I think that is fine. Thanks 

## 2015-08-02 NOTE — Telephone Encounter (Signed)
Noted message from Dr. Jertson, will close encounter.   

## 2015-08-07 ENCOUNTER — Ambulatory Visit (INDEPENDENT_AMBULATORY_CARE_PROVIDER_SITE_OTHER): Payer: BLUE CROSS/BLUE SHIELD | Admitting: Obstetrics and Gynecology

## 2015-08-07 ENCOUNTER — Encounter: Payer: Self-pay | Admitting: Obstetrics and Gynecology

## 2015-08-07 ENCOUNTER — Telehealth: Payer: Self-pay | Admitting: Obstetrics and Gynecology

## 2015-08-07 VITALS — BP 122/66 | HR 72 | Resp 16 | Wt 176.0 lb

## 2015-08-07 DIAGNOSIS — N950 Postmenopausal bleeding: Secondary | ICD-10-CM

## 2015-08-07 DIAGNOSIS — M1990 Unspecified osteoarthritis, unspecified site: Secondary | ICD-10-CM | POA: Insufficient documentation

## 2015-08-07 MED ORDER — MISOPROSTOL 200 MCG PO TABS
ORAL_TABLET | ORAL | Status: DC
Start: 2015-08-07 — End: 2015-08-07

## 2015-08-07 MED ORDER — MISOPROSTOL 200 MCG PO TABS: ORAL_TABLET | ORAL | Status: DC

## 2015-08-07 NOTE — Telephone Encounter (Signed)
Pharmacist from [facility] on [address] calling to clarify patient's rx for Misoprostol (Cytotec). Directions say take 2 tablets prior to appointment but pharmacy only received quantity of 1 tablet. Need verbal order for 2 tablets] [facility]: [phone]

## 2015-08-07 NOTE — Telephone Encounter (Signed)
Please call the pharmacist and change it to 2 tablets. Thanks

## 2015-08-07 NOTE — Telephone Encounter (Signed)
Order sent via [facility] for Cytotec 200 mcg tablets. 2 tablets PV the night before procedure.    Will close encounter.
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
## 2015-08-07 NOTE — Telephone Encounter (Signed)
Cytotec sent 08/07/15 #1tab/0R. Today #1/0R?

## 2015-08-08 ENCOUNTER — Telehealth: Payer: Self-pay | Admitting: Obstetrics and Gynecology
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
## 2015-08-08 NOTE — Telephone Encounter (Addendum)
Please call Dr.Beth Zachery Dauer regarding this mutual patient,  surgery 08/12/15.

## 2015-08-08 NOTE — Telephone Encounter (Signed)
Call to Dr. [place] office and spoke with nurse, Delaney Meigs at [phone]. She states she can see a note via [facility] My chart from the patient to Dr. Rennis Harding' PA where the PA did say she spoke with Dr. Rennis Harding and cleared her for surgery. She states she will send message to Dr. Rennis Harding' team and have records faxed or return my call as Dr. Rennis Harding is not in office.   Left message with Pre admissions testing nurse, Marylu Lund  that patient has a history of paroxsymal nocturnal hemoglobinuria and possible untreated sleep apnea and anesthesia consult recommended for tomorrows appointment.

## 2015-08-09 ENCOUNTER — Encounter (HOSPITAL_COMMUNITY)
Admission: RE | Admit: 2015-08-09 | Discharge: 2015-08-09 | Disposition: A | Discharge status: Home / Self Care | Payer: BLUE CROSS/BLUE SHIELD | Source: Ambulatory Visit | Attending: Obstetrics and Gynecology | Admitting: Obstetrics and Gynecology

## 2015-08-09 ENCOUNTER — Encounter (HOSPITAL_COMMUNITY): Payer: Self-pay

## 2015-08-09 DIAGNOSIS — F909 Attention-deficit hyperactivity disorder, unspecified type: Secondary | ICD-10-CM | POA: Diagnosis not present

## 2015-08-09 DIAGNOSIS — M19011 Primary osteoarthritis, right shoulder: Secondary | ICD-10-CM | POA: Diagnosis not present

## 2015-08-09 DIAGNOSIS — Z881 Allergy status to other antibiotic agents status: Secondary | ICD-10-CM | POA: Diagnosis not present

## 2015-08-09 DIAGNOSIS — Z79899 Other long term (current) drug therapy: Secondary | ICD-10-CM | POA: Diagnosis not present

## 2015-08-09 DIAGNOSIS — N950 Postmenopausal bleeding: Secondary | ICD-10-CM | POA: Diagnosis present

## 2015-08-09 DIAGNOSIS — Z888 Allergy status to other drugs, medicaments and biological substances status: Secondary | ICD-10-CM | POA: Diagnosis not present

## 2015-08-09 DIAGNOSIS — Z9851 Tubal ligation status: Secondary | ICD-10-CM | POA: Diagnosis not present

## 2015-08-09 DIAGNOSIS — Z882 Allergy status to sulfonamides status: Secondary | ICD-10-CM | POA: Diagnosis not present

## 2015-08-09 LAB — TYPE AND SCREEN
ABO/RH(D): A POS
Antibody Screen: NEGATIVE

## 2015-08-09 LAB — COMPREHENSIVE METABOLIC PANEL
ALT: 43 U/L (ref 14–54)
AST: 26 U/L (ref 15–41)
Albumin: 4.2 g/dL (ref 3.5–5.0)
Alkaline Phosphatase: 51 U/L (ref 38–126)
Anion gap: 5 (ref 5–15)
BUN: 16 mg/dL (ref 6–20)
CO2: 31 mmol/L (ref 22–32)
Calcium: 9.2 mg/dL (ref 8.9–10.3)
Chloride: 101 mmol/L (ref 101–111)
Creatinine, Ser: 0.7 mg/dL (ref 0.44–1.00)
GFR calc Af Amer: >60 mL/min (ref >60)
GFR calc non Af Amer: >60 mL/min (ref >60)
Glucose, Bld: 100 mg/dL — ABNORMAL HIGH (ref 65–99)
Potassium: 3.7 mmol/L (ref 3.5–5.1)
Sodium: 137 mmol/L (ref 135–145)
Total Bilirubin: 0.6 mg/dL (ref 0.3–1.2)
Total Protein: 7 g/dL (ref 6.5–8.1)

## 2015-08-09 LAB — CBC
HCT: 36.8 % (ref 36.0–46.0)
Hemoglobin: 11.9 g/dL — ABNORMAL LOW (ref 12.0–15.0)
MCH: 32 pg (ref 26.0–34.0)
MCHC: 32.3 g/dL (ref 30.0–36.0)
MCV: 98.9 fL (ref 78.0–100.0)
Platelets: 150 10*3/uL (ref 150–400)
RBC: 3.72 MIL/uL — ABNORMAL LOW (ref 3.87–5.11)
RDW: 13.6 % (ref 11.5–15.5)
WBC: 3.6 10*3/uL — ABNORMAL LOW (ref 4.0–10.5)

## 2015-08-09 LAB — ABO/RH: ABO/RH(D): A POS

## 2015-08-09 NOTE — Telephone Encounter (Signed)
Letter from Dr. Marybelle Killings office scanned into Epic.  Spoke with Dawn, nurse at [facility], she confirms she spoke with Anesthesia this morning and patient okay to have procedure, no changes ordered with patient history.   Routing to provider for final review. Patient agreeable to disposition. Will close encounter.

## 2015-08-09 NOTE — Patient Instructions (Addendum)
   Your procedure is scheduled on: OCT 3 AT 9AM  Enter through the Main Entrance of [facility] at: 730AM  Pick up the phone at the desk and dial [phone] and inform us of your arrival.  Please call this number if you have any problems the morning of surgery: [phone]  Remember: DO NOT EAT OR DRINK AFTER MIDNIGHT OCT 2   Do not wear jewelry, make-up, or FINGER nail polish No metal in your hair or on your body. Do not wear lotions, powders, perfumes.  You may wear deodorant.  Do not bring valuables to the hospital. Contacts, dentures or bridgework may not be worn into surgery.    Patients discharged on the day of surgery will not be allowed to drive home.

## 2015-08-12 ENCOUNTER — Ambulatory Visit (HOSPITAL_COMMUNITY)
Admission: RE | Admit: 2015-08-12 | Discharge: 2015-08-12 | Disposition: A | Discharge status: Home / Self Care | Payer: BLUE CROSS/BLUE SHIELD | Source: Ambulatory Visit | Attending: Obstetrics and Gynecology | Admitting: Obstetrics and Gynecology

## 2015-08-12 ENCOUNTER — Encounter (HOSPITAL_COMMUNITY)
Admission: RE | Disposition: A | Discharge status: Home / Self Care | Payer: Self-pay | Source: Ambulatory Visit | Attending: Obstetrics and Gynecology

## 2015-08-12 ENCOUNTER — Ambulatory Visit (HOSPITAL_COMMUNITY): Payer: BLUE CROSS/BLUE SHIELD | Admitting: Anesthesiology

## 2015-08-12 ENCOUNTER — Encounter (HOSPITAL_COMMUNITY): Payer: Self-pay | Admitting: *Deleted

## 2015-08-12 DIAGNOSIS — Z79899 Other long term (current) drug therapy: Secondary | ICD-10-CM | POA: Insufficient documentation

## 2015-08-12 DIAGNOSIS — M19011 Primary osteoarthritis, right shoulder: Secondary | ICD-10-CM | POA: Insufficient documentation

## 2015-08-12 DIAGNOSIS — Z888 Allergy status to other drugs, medicaments and biological substances status: Secondary | ICD-10-CM | POA: Insufficient documentation

## 2015-08-12 DIAGNOSIS — N950 Postmenopausal bleeding: Secondary | ICD-10-CM | POA: Insufficient documentation

## 2015-08-12 DIAGNOSIS — Z9851 Tubal ligation status: Secondary | ICD-10-CM | POA: Insufficient documentation

## 2015-08-12 DIAGNOSIS — Z881 Allergy status to other antibiotic agents status: Secondary | ICD-10-CM | POA: Insufficient documentation

## 2015-08-12 DIAGNOSIS — Z882 Allergy status to sulfonamides status: Secondary | ICD-10-CM | POA: Insufficient documentation

## 2015-08-12 DIAGNOSIS — F909 Attention-deficit hyperactivity disorder, unspecified type: Secondary | ICD-10-CM | POA: Insufficient documentation

## 2015-08-12 HISTORY — PX: DILATATION & CURETTAGE/HYSTEROSCOPY WITH MYOSURE: SHX6511

## 2015-08-12 SURGERY — DILATATION & CURETTAGE/HYSTEROSCOPY WITH MYOSURE
Anesthesia: General

## 2015-08-12 MED ORDER — FENTANYL CITRATE (PF) 100 MCG/2ML IJ SOLN
INTRAMUSCULAR | Status: DC | PRN
  Administered 2015-08-12 (×2): 50 ug via INTRAVENOUS

## 2015-08-12 MED ORDER — MIDAZOLAM HCL 5 MG/5ML IJ SOLN
INTRAMUSCULAR | Status: DC | PRN
  Administered 2015-08-12: 2 mg via INTRAVENOUS

## 2015-08-12 MED ORDER — SODIUM CHLORIDE 0.9 % IR SOLN
Status: DC | PRN
  Administered 2015-08-12: 500 mL

## 2015-08-12 MED ORDER — LIDOCAINE HCL (CARDIAC) 20 MG/ML IV SOLN
INTRAVENOUS | Status: AC
  Filled 2015-08-12: qty 5

## 2015-08-12 MED ORDER — SCOPOLAMINE 1 MG/3DAYS TD PT72
1.0000 | MEDICATED_PATCH | Freq: Once | TRANSDERMAL | Status: DC
  Administered 2015-08-12: 1.5 mg via TRANSDERMAL

## 2015-08-12 MED ORDER — LACTATED RINGERS IV SOLN: INTRAVENOUS | Status: DC

## 2015-08-12 MED ORDER — KETOROLAC TROMETHAMINE 30 MG/ML IJ SOLN
30.0000 mg | Freq: Once | INTRAMUSCULAR | Status: AC
  Administered 2015-08-12: 30 mg via INTRAVENOUS

## 2015-08-12 MED ORDER — LACTATED RINGERS IV SOLN
INTRAVENOUS | Status: DC
  Administered 2015-08-12 (×2): via INTRAVENOUS

## 2015-08-12 MED ORDER — ONDANSETRON HCL 4 MG/2ML IJ SOLN
INTRAMUSCULAR | Status: DC | PRN
  Administered 2015-08-12: 4 mg via INTRAVENOUS

## 2015-08-12 MED ORDER — PROPOFOL 10 MG/ML IV BOLUS
INTRAVENOUS | Status: AC
  Filled 2015-08-12: qty 20

## 2015-08-12 MED ORDER — SCOPOLAMINE 1 MG/3DAYS TD PT72
MEDICATED_PATCH | TRANSDERMAL | Status: AC
  Filled 2015-08-12: qty 1

## 2015-08-12 MED ORDER — FENTANYL CITRATE (PF) 100 MCG/2ML IJ SOLN: 25.0000 ug | INTRAMUSCULAR | Status: DC | PRN

## 2015-08-12 MED ORDER — PROPOFOL 10 MG/ML IV BOLUS
INTRAVENOUS | Status: DC | PRN
  Administered 2015-08-12: 200 mg via INTRAVENOUS
  Administered 2015-08-12: 50 mg via INTRAVENOUS

## 2015-08-12 MED ORDER — KETOROLAC TROMETHAMINE 30 MG/ML IJ SOLN
INTRAMUSCULAR | Status: AC
  Administered 2015-08-12: 30 mg via INTRAVENOUS
  Filled 2015-08-12: qty 1

## 2015-08-12 MED ORDER — ONDANSETRON HCL 4 MG/2ML IJ SOLN
INTRAMUSCULAR | Status: AC
  Filled 2015-08-12: qty 2

## 2015-08-12 MED ORDER — LIDOCAINE HCL (CARDIAC) 20 MG/ML IV SOLN
INTRAVENOUS | Status: DC | PRN
  Administered 2015-08-12: 40 mg via INTRAVENOUS

## 2015-08-12 MED ORDER — MIDAZOLAM HCL 2 MG/2ML IJ SOLN
INTRAMUSCULAR | Status: AC
  Filled 2015-08-12: qty 4

## 2015-08-12 MED ORDER — PROMETHAZINE HCL 25 MG/ML IJ SOLN
6.2500 mg | INTRAMUSCULAR | Status: DC | PRN
Start: 2015-08-12 — End: 2015-08-12

## 2015-08-12 MED ORDER — FENTANYL CITRATE (PF) 100 MCG/2ML IJ SOLN
INTRAMUSCULAR | Status: AC
  Filled 2015-08-12: qty 4

## 2015-08-12 MED ORDER — DEXAMETHASONE SODIUM PHOSPHATE 10 MG/ML IJ SOLN
INTRAMUSCULAR | Status: DC | PRN
  Administered 2015-08-12: 10 mg via INTRAVENOUS

## 2015-08-12 MED ORDER — DEXAMETHASONE SODIUM PHOSPHATE 10 MG/ML IJ SOLN
INTRAMUSCULAR | Status: AC
  Filled 2015-08-12: qty 1

## 2015-08-12 SURGICAL SUPPLY — 21 items
CANISTER SUCT 3000ML (MISCELLANEOUS) ×3 IMPLANT
CATH ROBINSON RED A/P 16FR (CATHETERS) ×3 IMPLANT
CLOTH BEACON ORANGE TIMEOUT ST (SAFETY) ×3 IMPLANT
CONTAINER PREFILL 10% NBF 60ML (FORM) ×6 IMPLANT
DEVICE MYOSURE CLASSIC (MISCELLANEOUS) IMPLANT
DEVICE MYOSURE LITE (MISCELLANEOUS) IMPLANT
FILTER ARTHROSCOPY CONVERTOR (FILTER) ×3 IMPLANT
GLOVE BIO SURGEON STRL SZ 6.5 (GLOVE) ×2 IMPLANT
GLOVE BIO SURGEONS STRL SZ 6.5 (GLOVE) ×1
GLOVE BIOGEL PI IND STRL 7.0 (GLOVE) ×1 IMPLANT
GLOVE BIOGEL PI INDICATOR 7.0 (GLOVE) ×2
GOWN STRL REUS W/TWL LRG LVL3 (GOWN DISPOSABLE) ×6 IMPLANT
MYOSURE XL FIBROID REM (MISCELLANEOUS)
PACK VAGINAL MINOR WOMEN LF (CUSTOM PROCEDURE TRAY) ×3 IMPLANT
PAD OB MATERNITY 4.3X12.25 (PERSONAL CARE ITEMS) ×3 IMPLANT
SEAL ROD LENS SCOPE MYOSURE (ABLATOR) ×3 IMPLANT
SYSTEM TISS REMOVAL MYSR XL RM (MISCELLANEOUS) IMPLANT
TOWEL OR 17X24 6PK STRL BLUE (TOWEL DISPOSABLE) ×6 IMPLANT
TUBING AQUILEX INFLOW (TUBING) ×3 IMPLANT
TUBING AQUILEX OUTFLOW (TUBING) ×3 IMPLANT
WATER STERILE IRR 1000ML POUR (IV SOLUTION) ×3 IMPLANT

## 2015-08-12 NOTE — Anesthesia Procedure Notes (Signed)
Procedure Name: LMA Insertion Date/Time: 08/12/2015 8:34 AM Performed by: Fanny Dance L Pre-anesthesia Checklist: Patient identified, Emergency Drugs available, Suction available and Patient being monitored Patient Re-evaluated:Patient Re-evaluated prior to inductionOxygen Delivery Method: Circle system utilized Preoxygenation: Pre-oxygenation with 100% oxygen Intubation Type: IV induction Ventilation: Mask ventilation without difficulty LMA: LMA inserted LMA Size: 4.0 Number of attempts: 1 Placement Confirmation: positive ETCO2,  CO2 detector and breath sounds checked- equal and bilateral Tube secured with: Tape Dental Injury: Teeth and Oropharynx as per pre-operative assessment

## 2015-08-12 NOTE — Anesthesia Preprocedure Evaluation (Addendum)
Anesthesia Evaluation  Patient identified by MRN, date of birth, ID band Patient awake    Reviewed: Allergy & Precautions, NPO status , Patient's Chart, lab work & pertinent test results  Airway Mallampati: II  TM Distance: <3 FB Neck ROM: Full    Dental  (+) Teeth Intact, Dental Advisory Given   Pulmonary shortness of breath,    Pulmonary exam normal breath sounds clear to auscultation       Cardiovascular Exercise Tolerance: Good (-) hypertension(-) anginanegative cardio ROS Normal cardiovascular exam Rhythm:Regular Rate:Normal     Neuro/Psych PSYCHIATRIC DISORDERS (ADHD) negative neurological ROS     GI/Hepatic negative GI ROS, Neg liver ROS,   Endo/Other  Lupus  Renal/GU negative Renal ROS  Female GU complaint     Musculoskeletal  (+) Arthritis ,   Abdominal   Peds  Hematology  (+) Blood dyscrasia, anemia ,   Anesthesia Other Findings Day of surgery medications reviewed with the patient.  Reproductive/Obstetrics                           Anesthesia Physical Anesthesia Plan  ASA: II  Anesthesia Plan: General   Post-op Pain Management:    Induction: Intravenous  Airway Management Planned: LMA  Additional Equipment:   Intra-op Plan:   Post-operative Plan: Extubation in OR  Informed Consent: I have reviewed the patients History and Physical, chart, labs and discussed the procedure including the risks, benefits and alternatives for the proposed anesthesia with the patient or authorized representative who has indicated his/her understanding and acceptance.   Dental advisory given  Plan Discussed with: CRNA  Anesthesia Plan Comments: (Risks/benefits of general anesthesia discussed with patient including risk of damage to teeth, lips, gum, and tongue, nausea/vomiting, allergic reactions to medications, and the possibility of heart attack, stroke and death.  All patient  questions answered.  Patient wishes to proceed.)        Anesthesia Quick Evaluation

## 2015-08-12 NOTE — Transfer of Care (Signed)
Immediate Anesthesia Transfer of Care Note  Patient: Diane Moody  Procedure(s) Performed: Procedure(s): DILATATION & CURETTAGE/HYSTEROSCOPY (N/A)  Patient Location: PACU  Anesthesia Type:General  Level of Consciousness: awake  Airway & Oxygen Therapy: Patient Spontanous Breathing and Patient connected to nasal cannula oxygen  Post-op Assessment: Report given to RN and Post -op Vital signs reviewed and stable  Post vital signs: stable  Last Vitals:  Filed Vitals:   08/12/15 0752  BP: 117/61  Pulse: 60  Temp: 36.7 C  Resp: 16    Complications: No apparent anesthesia complications

## 2015-08-12 NOTE — Discharge Instructions (Signed)

## 2015-08-12 NOTE — Op Note (Addendum)
Preoperative Diagnosis: Postmenopausal bleeding  Postoperative Diagnosis: Same  Procedure: Hysteroscopy, dilation and curettage  Surgeon: Dr Gertie Exon  Assistants: None  Anesthesia: General via LMA  EBL: 5cc  Fluids: 1,000 cc LR  Fluid deficit: 50 cc  Urine output: not recoreded  Indications for surgery: The patient is a 54 yo female, who presented with PMP bleeding. Work up included a benign endometrial biopsy, ultrasound with a thickened stripe, sonohysterogram with limited visualization.  The risks of the surgery were reviewed with the patient and the consent form was signed prior to her surgery.  Findings: EUA: normal sized uterus, no adnexal masses. Hysteroscopy: normal tubal ostia bilaterally, small synechia near the left tubal ostia, thin endometrium. No intrauterine masses.   Specimens: Endometrial curettings   Procedure: The patient was taken to the operating room with an IV in place. She was placed in the dorsal lithotomy position and anesthesia was administered. She was prepped and draped in the usual sterile fashion for a vaginal procedure. She voided on the way to the OR. A weighted speculum was placed in the vagina and a single tooth tenaculum was placed on the anterior lip of the cervix. The cervix was dilated to a # 6 hagar dilator. The uterus was sounded to 6-7 cm. The operative hysteroscope was inserted into the uterine cavity. With continuous infusion of normal saline, the uterine cavity was visualized with the above findings. The hysteroscope was then removed. The cavity was then curetted with the small sharp curette. The cavity had the characteristically gritty texture at the end of the procedure. The curette and the single tooth tenaculum were removed. The speculum was removed. The patients perineum was cleansed of betadine and she was taken out of the dorsal lithotomy position.  Upon awakening the LMA was removed and the patient was transferred to the recovery  room in stable and awake condition.  The sponge and instrument count were correct. There were no complications.    Please send a copy of this report to myself, Dr Juluis Rainier, Dr Willette Pa, Dr Norma Fredrickson

## 2015-08-12 NOTE — Interval H&P Note (Signed)
History and Physical Interval Note:  08/12/2015 8:22 AM  Diane Moody  has presented today for surgery, with the diagnosis of Post menopausal bleeding  The various methods of treatment have been discussed with the patient and family. After consideration of risks, benefits and other options for treatment, the patient has consented to  Procedure(s): DILATATION & CURETTAGE/HYSTEROSCOPY WITH MYOSURE (N/A) as a surgical intervention .  The patient's history has been reviewed, patient examined, no change in status, stable for surgery.  I have reviewed the patient's chart and labs.  Questions were answered to the patient's satisfaction.     [name]

## 2015-08-13 ENCOUNTER — Encounter (HOSPITAL_COMMUNITY): Payer: Self-pay | Admitting: Obstetrics and Gynecology

## 2015-08-13 NOTE — Anesthesia Postprocedure Evaluation (Signed)
  Anesthesia Post-op Note  Patient: Diane Moody  Procedure(s) Performed: Procedure(s) (LRB): DILATATION & CURETTAGE/HYSTEROSCOPY (N/A)  Patient Location: PACU  Anesthesia Type: General  Level of Consciousness: awake and alert   Airway and Oxygen Therapy: Patient Spontanous Breathing  Post-op Pain: mild  Post-op Assessment: Post-op Vital signs reviewed, Patient's Cardiovascular Status Stable, Respiratory Function Stable, Patent Airway and No signs of Nausea or vomiting  Last Vitals:  Filed Vitals:   08/12/15 1015  BP: 117/63  Pulse: 52  Temp:   Resp: 13    Post-op Vital Signs: stable   Complications: No apparent anesthesia complications

## 2015-08-26 ENCOUNTER — Encounter: Payer: Self-pay | Admitting: Obstetrics and Gynecology

## 2015-08-26 ENCOUNTER — Ambulatory Visit (INDEPENDENT_AMBULATORY_CARE_PROVIDER_SITE_OTHER): Payer: BLUE CROSS/BLUE SHIELD | Admitting: Obstetrics and Gynecology

## 2015-08-26 VITALS — BP 118/76 | HR 88 | Resp 16 | Wt 174.0 lb

## 2015-08-26 DIAGNOSIS — N950 Postmenopausal bleeding: Secondary | ICD-10-CM | POA: Diagnosis not present

## 2015-08-26 DIAGNOSIS — Z9889 Other specified postprocedural states: Secondary | ICD-10-CM | POA: Diagnosis not present

## 2015-08-26 NOTE — Progress Notes (Signed)
Patient ID: Diane Moody, female   DOB: 11/03/1961, 54 y.o.   MRN: [id number] Post Operative Visit  Procedure:D&C Hysteroscopy with myosure, atrophic endometrium, small synechia otherwise negative hysteroscopy. Pathology with atrophic endometrium. Days Post-op: 14 days   Subjective: She had slight spotting for a few days. No further bleeding. No cramping. She has been really tired. She is going to see her old Rheumatologist (whom she really trusts) in the near future and try to figure out if she does or doesn't have lupus.   Objective: BP 118/76 mmHg  Pulse 88  Resp 16  Wt 174 lb (78.926 kg)  EXAM General: alert, cooperative and no distress GYN: BM exam: no CMT, uterus normal sized, mobile, not tender. No adnexal masses or tenderness.  Review of Systems  Gastrointestinal: Negative.   Genitourinary: Negative.   All other systems reviewed and are negative.   Assessment: 2 weeks s/p hysteroscopy, D&C, benign pathology  Plan: Routine f/u

## 2015-10-17 ENCOUNTER — Ambulatory Visit
Admission: RE | Admit: 2015-10-17 | Discharge: 2015-10-17 | Disposition: A | Discharge status: Home / Self Care | Payer: BLUE CROSS/BLUE SHIELD | Source: Ambulatory Visit | Attending: Allergy | Admitting: Allergy

## 2015-10-17 ENCOUNTER — Other Ambulatory Visit: Payer: Self-pay | Admitting: Allergy

## 2015-10-17 DIAGNOSIS — R05 Cough: Secondary | ICD-10-CM | POA: Insufficient documentation

## 2015-10-17 DIAGNOSIS — J209 Acute bronchitis, unspecified: Secondary | ICD-10-CM

## 2016-02-18 DIAGNOSIS — J3089 Other allergic rhinitis: Secondary | ICD-10-CM | POA: Diagnosis not present

## 2016-02-18 DIAGNOSIS — J301 Allergic rhinitis due to pollen: Secondary | ICD-10-CM | POA: Diagnosis not present

## 2016-02-19 DIAGNOSIS — Z79899 Other long term (current) drug therapy: Secondary | ICD-10-CM | POA: Diagnosis not present

## 2016-02-19 DIAGNOSIS — D595 Paroxysmal nocturnal hemoglobinuria [Marchiafava-Micheli]: Secondary | ICD-10-CM | POA: Diagnosis not present

## 2016-02-26 DIAGNOSIS — J301 Allergic rhinitis due to pollen: Secondary | ICD-10-CM | POA: Diagnosis not present

## 2016-02-26 DIAGNOSIS — J3089 Other allergic rhinitis: Secondary | ICD-10-CM | POA: Diagnosis not present

## 2016-03-04 ENCOUNTER — Encounter: Payer: Self-pay | Admitting: Nurse Practitioner

## 2016-03-04 ENCOUNTER — Ambulatory Visit (INDEPENDENT_AMBULATORY_CARE_PROVIDER_SITE_OTHER): Payer: BLUE CROSS/BLUE SHIELD | Admitting: Nurse Practitioner

## 2016-03-04 VITALS — BP 110/72 | HR 64 | Ht 66.75 in | Wt 179.0 lb

## 2016-03-04 DIAGNOSIS — M3500 Sicca syndrome, unspecified: Secondary | ICD-10-CM

## 2016-03-04 DIAGNOSIS — IMO0002 Reserved for concepts with insufficient information to code with codable children: Secondary | ICD-10-CM

## 2016-03-04 DIAGNOSIS — M329 Systemic lupus erythematosus, unspecified: Secondary | ICD-10-CM

## 2016-03-04 DIAGNOSIS — Z7982 Long term (current) use of aspirin: Secondary | ICD-10-CM | POA: Diagnosis not present

## 2016-03-04 DIAGNOSIS — Z0000 Encounter for general adult medical examination without abnormal findings: Secondary | ICD-10-CM

## 2016-03-04 DIAGNOSIS — D8989 Other specified disorders involving the immune mechanism, not elsewhere classified: Secondary | ICD-10-CM | POA: Insufficient documentation

## 2016-03-04 DIAGNOSIS — Z01419 Encounter for gynecological examination (general) (routine) without abnormal findings: Secondary | ICD-10-CM | POA: Diagnosis not present

## 2016-03-04 DIAGNOSIS — E538 Deficiency of other specified B group vitamins: Secondary | ICD-10-CM | POA: Diagnosis not present

## 2016-03-04 DIAGNOSIS — D595 Paroxysmal nocturnal hemoglobinuria [Marchiafava-Micheli]: Secondary | ICD-10-CM | POA: Diagnosis not present

## 2016-03-04 DIAGNOSIS — Z79899 Other long term (current) drug therapy: Secondary | ICD-10-CM | POA: Diagnosis not present

## 2016-03-04 HISTORY — DX: Other specified disorders involving the immune mechanism, not elsewhere classified: D89.89

## 2016-03-04 LAB — POCT URINALYSIS DIPSTICK
Bilirubin, UA: NEGATIVE
Blood, UA: NEGATIVE
Glucose, UA: NEGATIVE
Ketones, UA: NEGATIVE
Leukocytes, UA: NEGATIVE
Nitrite, UA: NEGATIVE
Protein, UA: NEGATIVE
Urobilinogen, UA: NEGATIVE
pH, UA: 6
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
## 2016-03-04 NOTE — Patient Instructions (Addendum)

## 2016-03-05 DIAGNOSIS — J3089 Other allergic rhinitis: Secondary | ICD-10-CM | POA: Diagnosis not present

## 2016-03-05 DIAGNOSIS — J301 Allergic rhinitis due to pollen: Secondary | ICD-10-CM | POA: Diagnosis not present

## 2016-03-05 LAB — HEPATITIS C ANTIBODY: HCV Ab: NEGATIVE

## 2016-03-05 LAB — HIV ANTIBODY (ROUTINE TESTING W REFLEX): HIV 1&2 Ab, 4th Generation: NONREACTIVE

## 2016-03-07 NOTE — Progress Notes (Signed)
Encounter reviewed by Dr. Myley Bahner Amundson C. Silva.  

## 2016-03-13 DIAGNOSIS — J301 Allergic rhinitis due to pollen: Secondary | ICD-10-CM | POA: Diagnosis not present

## 2016-03-13 DIAGNOSIS — J3089 Other allergic rhinitis: Secondary | ICD-10-CM | POA: Diagnosis not present

## 2016-03-18 DIAGNOSIS — Z79899 Other long term (current) drug therapy: Secondary | ICD-10-CM | POA: Diagnosis not present

## 2016-03-18 DIAGNOSIS — D595 Paroxysmal nocturnal hemoglobinuria [Marchiafava-Micheli]: Secondary | ICD-10-CM | POA: Diagnosis not present

## 2016-03-19 DIAGNOSIS — J3089 Other allergic rhinitis: Secondary | ICD-10-CM | POA: Diagnosis not present

## 2016-03-19 DIAGNOSIS — J301 Allergic rhinitis due to pollen: Secondary | ICD-10-CM | POA: Diagnosis not present

## 2016-03-23 DIAGNOSIS — Z1231 Encounter for screening mammogram for malignant neoplasm of breast: Secondary | ICD-10-CM | POA: Diagnosis not present

## 2016-03-23 DIAGNOSIS — Z803 Family history of malignant neoplasm of breast: Secondary | ICD-10-CM | POA: Diagnosis not present

## 2016-03-26 DIAGNOSIS — D2311 Other benign neoplasm of skin of right eyelid, including canthus: Secondary | ICD-10-CM | POA: Diagnosis not present

## 2016-03-26 DIAGNOSIS — M3500 Sicca syndrome, unspecified: Secondary | ICD-10-CM | POA: Diagnosis not present

## 2016-03-26 DIAGNOSIS — M329 Systemic lupus erythematosus, unspecified: Secondary | ICD-10-CM | POA: Diagnosis not present

## 2016-03-26 DIAGNOSIS — Z79899 Other long term (current) drug therapy: Secondary | ICD-10-CM | POA: Diagnosis not present

## 2016-03-30 ENCOUNTER — Ambulatory Visit (INDEPENDENT_AMBULATORY_CARE_PROVIDER_SITE_OTHER): Payer: BLUE CROSS/BLUE SHIELD | Admitting: Physician Assistant

## 2016-03-30 VITALS — BP 124/82 | HR 79 | Temp 97.6°F | Resp 16 | Ht 67.0 in | Wt 178.0 lb

## 2016-03-30 DIAGNOSIS — J0140 Acute pansinusitis, unspecified: Secondary | ICD-10-CM | POA: Diagnosis not present

## 2016-03-30 MED ORDER — CEFDINIR 300 MG PO CAPS: 300.0000 mg | ORAL_CAPSULE | Freq: Two times a day (BID) | ORAL | Status: AC

## 2016-03-30 NOTE — Patient Instructions (Addendum)
IF you received an x-ray today, you will receive an invoice from [facility]. Please contact [facility] at [phone] with questions or concerns regarding your invoice.   IF you received labwork today, you will receive an invoice from [employer]. Please contact [facility] at [phone] with questions or concerns regarding your invoice.   Our billing staff will not be able to assist you with questions regarding bills from these companies.  You will be contacted with the lab results as soon as they are available. The fastest way to get your results is to activate your My Chart account. Instructions are located on the last page of this paperwork. If you have not heard from [place] regarding the results in 2 weeks, please contact this office.    Take medication to completion.  I would like you to hydrate well.  Please continue your allergy medications. Please take the antibiotic to completion.   Sinusitis, Adult Sinusitis is redness, soreness, and inflammation of the paranasal sinuses. Paranasal sinuses are air pockets within the bones of your face. They are located beneath your eyes, in the middle of your forehead, and above your eyes. In healthy paranasal sinuses, mucus is able to drain out, and air is able to circulate through them by way of your nose. However, when your paranasal sinuses are inflamed, mucus and air can become trapped. This can allow bacteria and other germs to grow and cause infection. Sinusitis can develop quickly and last only a short time (acute) or continue over a long period (chronic). Sinusitis that lasts for more than 12 weeks is considered chronic. CAUSES Causes of sinusitis include:  Allergies.  Structural abnormalities, such as displacement of the cartilage that separates your nostrils (deviated septum), which can decrease the air flow through your nose and sinuses and affect sinus drainage.  Functional abnormalities,  such as when the small hairs (cilia) that line your sinuses and help remove mucus do not work properly or are not present. SIGNS AND SYMPTOMS Symptoms of acute and chronic sinusitis are the same. The primary symptoms are pain and pressure around the affected sinuses. Other symptoms include:  Upper toothache.  Earache.  Headache.  Bad breath.  Decreased sense of smell and taste.  A cough, which worsens when you are lying flat.  Fatigue.  Fever.  Thick drainage from your nose, which often is green and may contain pus (purulent).  Swelling and warmth over the affected sinuses. DIAGNOSIS Your health care provider will perform a physical exam. During your exam, your health care provider may perform any of the following to help determine if you have acute sinusitis or chronic sinusitis:  Look in your nose for signs of abnormal growths in your nostrils (nasal polyps).  Tap over the affected sinus to check for signs of infection.  View the inside of your sinuses using an imaging device that has a light attached (endoscope). If your health care provider suspects that you have chronic sinusitis, one or more of the following tests may be recommended:  Allergy tests.  Nasal culture. A sample of mucus is taken from your nose, sent to a lab, and screened for bacteria.  Nasal cytology. A sample of mucus is taken from your nose and examined by your health care provider to determine if your sinusitis is related to an allergy. TREATMENT Most cases of acute sinusitis are related to a viral infection and will resolve on their own within 10 days. Sometimes, medicines are prescribed to  help relieve symptoms of both acute and chronic sinusitis. These may include pain medicines, decongestants, nasal steroid sprays, or saline sprays. However, for sinusitis related to a bacterial infection, your health care provider will prescribe antibiotic medicines. These are medicines that will help kill the  bacteria causing the infection. Rarely, sinusitis is caused by a fungal infection. In these cases, your health care provider will prescribe antifungal medicine. For some cases of chronic sinusitis, surgery is needed. Generally, these are cases in which sinusitis recurs more than 3 times per year, despite other treatments. HOME CARE INSTRUCTIONS  Drink plenty of water. Water helps thin the mucus so your sinuses can drain more easily.  Use a humidifier.  Inhale steam 3-4 times a day (for example, sit in the bathroom with the shower running).  Apply a warm, moist washcloth to your face 3-4 times a day, or as directed by your health care provider.  Use saline nasal sprays to help moisten and clean your sinuses.  Take medicines only as directed by your health care provider.  If you were prescribed either an antibiotic or antifungal medicine, finish it all even if you start to feel better. SEEK IMMEDIATE MEDICAL CARE IF:  You have increasing pain or severe headaches.  You have nausea, vomiting, or drowsiness.  You have swelling around your face.  You have vision problems.  You have a stiff neck.  You have difficulty breathing.   This information is not intended to replace advice given to you by your health care provider. Make sure you discuss any questions you have with your health care provider.   Document Released: 10/26/2005 Document Revised: 11/16/2014 Document Reviewed: 11/10/2011 Elsevier Interactive Patient Education [employer]2016 Elsevier Inc.
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
## 2016-04-02 DIAGNOSIS — Z79899 Other long term (current) drug therapy: Secondary | ICD-10-CM | POA: Diagnosis not present

## 2016-04-02 DIAGNOSIS — D595 Paroxysmal nocturnal hemoglobinuria [Marchiafava-Micheli]: Secondary | ICD-10-CM | POA: Diagnosis not present

## 2016-04-10 DIAGNOSIS — J3089 Other allergic rhinitis: Secondary | ICD-10-CM | POA: Diagnosis not present

## 2016-04-10 DIAGNOSIS — J301 Allergic rhinitis due to pollen: Secondary | ICD-10-CM | POA: Diagnosis not present

## 2016-04-14 DIAGNOSIS — J301 Allergic rhinitis due to pollen: Secondary | ICD-10-CM | POA: Diagnosis not present

## 2016-04-14 DIAGNOSIS — J3089 Other allergic rhinitis: Secondary | ICD-10-CM | POA: Diagnosis not present

## 2016-04-15 DIAGNOSIS — Z79899 Other long term (current) drug therapy: Secondary | ICD-10-CM | POA: Diagnosis not present

## 2016-04-15 DIAGNOSIS — D595 Paroxysmal nocturnal hemoglobinuria [Marchiafava-Micheli]: Secondary | ICD-10-CM | POA: Diagnosis not present

## 2016-04-16 ENCOUNTER — Ambulatory Visit (INDEPENDENT_AMBULATORY_CARE_PROVIDER_SITE_OTHER): Payer: BLUE CROSS/BLUE SHIELD | Admitting: Family Medicine

## 2016-04-16 ENCOUNTER — Encounter: Payer: Self-pay | Admitting: Family Medicine

## 2016-04-16 VITALS — BP 112/80 | HR 98 | Temp 98.5°F | Resp 16 | Ht 67.0 in | Wt 176.6 lb

## 2016-04-16 DIAGNOSIS — S90414A Abrasion, right lesser toe(s), initial encounter: Secondary | ICD-10-CM | POA: Diagnosis not present

## 2016-04-16 DIAGNOSIS — S91109A Unspecified open wound of unspecified toe(s) without damage to nail, initial encounter: Secondary | ICD-10-CM | POA: Diagnosis not present

## 2016-04-16 MED ORDER — MUPIROCIN 2 % EX OINT: 1.0000 "application " | TOPICAL_OINTMENT | Freq: Three times a day (TID) | CUTANEOUS | Status: DC
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
## 2016-04-16 NOTE — Patient Instructions (Addendum)
     IF you received an x-ray today, you will receive an invoice from [facility]. Please contact [facility] at [phone] with questions or concerns regarding your invoice.   IF you received labwork today, you will receive an invoice from [facility]. Please contact [facility] at [phone] with questions or concerns regarding your invoice.   Our billing staff will not be able to assist you with questions regarding bills from these companies.  You will be contacted with the lab results as soon as they are available. The fastest way to get your results is to activate your My Chart account. Instructions are located on the last page of this paperwork. If you have not heard from us regarding the results in 2 weeks, please contact this office.    Your toe wound may be an abrasion or possible insect/spider bite.  Clean with soap and water 2-3 times per day and apply mupirocin and bandage.  If any worsening in next few days - return for recheck. If not improving in 1 week - can refer to dermatology.  Return to the clinic or go to the nearest emergency room if any of your symptoms worsen or new symptoms occur.

## 2016-04-19 LAB — WOUND CULTURE
Gram Stain: NONE SEEN
Gram Stain: NONE SEEN
Gram Stain: NONE SEEN

## 2016-04-21 DIAGNOSIS — J3089 Other allergic rhinitis: Secondary | ICD-10-CM | POA: Diagnosis not present

## 2016-04-21 DIAGNOSIS — J301 Allergic rhinitis due to pollen: Secondary | ICD-10-CM | POA: Diagnosis not present

## 2016-04-27 ENCOUNTER — Encounter: Payer: Self-pay | Admitting: Family Medicine

## 2016-04-29 DIAGNOSIS — Z79899 Other long term (current) drug therapy: Secondary | ICD-10-CM | POA: Diagnosis not present

## 2016-04-29 DIAGNOSIS — D595 Paroxysmal nocturnal hemoglobinuria [Marchiafava-Micheli]: Secondary | ICD-10-CM | POA: Diagnosis not present

## 2016-04-30 DIAGNOSIS — J301 Allergic rhinitis due to pollen: Secondary | ICD-10-CM | POA: Diagnosis not present

## 2016-04-30 DIAGNOSIS — J3089 Other allergic rhinitis: Secondary | ICD-10-CM | POA: Diagnosis not present

## 2016-05-01 DIAGNOSIS — J301 Allergic rhinitis due to pollen: Secondary | ICD-10-CM | POA: Diagnosis not present

## 2016-05-01 DIAGNOSIS — J3089 Other allergic rhinitis: Secondary | ICD-10-CM | POA: Diagnosis not present

## 2016-05-05 DIAGNOSIS — J3089 Other allergic rhinitis: Secondary | ICD-10-CM | POA: Diagnosis not present

## 2016-05-05 DIAGNOSIS — J301 Allergic rhinitis due to pollen: Secondary | ICD-10-CM | POA: Diagnosis not present

## 2016-05-18 DIAGNOSIS — Z79899 Other long term (current) drug therapy: Secondary | ICD-10-CM | POA: Diagnosis not present

## 2016-05-18 DIAGNOSIS — D595 Paroxysmal nocturnal hemoglobinuria [Marchiafava-Micheli]: Secondary | ICD-10-CM | POA: Diagnosis not present

## 2016-05-21 DIAGNOSIS — J301 Allergic rhinitis due to pollen: Secondary | ICD-10-CM | POA: Diagnosis not present

## 2016-05-21 DIAGNOSIS — J3089 Other allergic rhinitis: Secondary | ICD-10-CM | POA: Diagnosis not present

## 2016-06-01 DIAGNOSIS — D595 Paroxysmal nocturnal hemoglobinuria [Marchiafava-Micheli]: Secondary | ICD-10-CM | POA: Diagnosis not present

## 2016-06-01 DIAGNOSIS — Z79899 Other long term (current) drug therapy: Secondary | ICD-10-CM | POA: Diagnosis not present

## 2016-06-04 DIAGNOSIS — J3089 Other allergic rhinitis: Secondary | ICD-10-CM | POA: Diagnosis not present

## 2016-06-04 DIAGNOSIS — J301 Allergic rhinitis due to pollen: Secondary | ICD-10-CM | POA: Diagnosis not present

## 2016-06-05 DIAGNOSIS — M797 Fibromyalgia: Secondary | ICD-10-CM | POA: Diagnosis not present

## 2016-06-05 DIAGNOSIS — D8989 Other specified disorders involving the immune mechanism, not elsewhere classified: Secondary | ICD-10-CM | POA: Diagnosis not present

## 2016-06-05 DIAGNOSIS — Z9229 Personal history of other drug therapy: Secondary | ICD-10-CM | POA: Diagnosis not present

## 2016-06-05 DIAGNOSIS — D595 Paroxysmal nocturnal hemoglobinuria [Marchiafava-Micheli]: Secondary | ICD-10-CM | POA: Diagnosis not present

## 2016-06-05 DIAGNOSIS — M150 Primary generalized (osteo)arthritis: Secondary | ICD-10-CM | POA: Diagnosis not present

## 2016-06-05 DIAGNOSIS — M329 Systemic lupus erythematosus, unspecified: Secondary | ICD-10-CM | POA: Diagnosis not present

## 2016-06-05 DIAGNOSIS — M3500 Sicca syndrome, unspecified: Secondary | ICD-10-CM | POA: Diagnosis not present

## 2016-06-11 DIAGNOSIS — J301 Allergic rhinitis due to pollen: Secondary | ICD-10-CM | POA: Diagnosis not present

## 2016-06-11 DIAGNOSIS — J3089 Other allergic rhinitis: Secondary | ICD-10-CM | POA: Diagnosis not present

## 2016-06-19 DIAGNOSIS — J3089 Other allergic rhinitis: Secondary | ICD-10-CM | POA: Diagnosis not present

## 2016-06-19 DIAGNOSIS — J301 Allergic rhinitis due to pollen: Secondary | ICD-10-CM | POA: Diagnosis not present

## 2016-06-19 DIAGNOSIS — D595 Paroxysmal nocturnal hemoglobinuria [Marchiafava-Micheli]: Secondary | ICD-10-CM | POA: Diagnosis not present

## 2016-06-19 DIAGNOSIS — Z79899 Other long term (current) drug therapy: Secondary | ICD-10-CM | POA: Diagnosis not present

## 2016-07-06 DIAGNOSIS — Z79899 Other long term (current) drug therapy: Secondary | ICD-10-CM | POA: Diagnosis not present

## 2016-07-06 DIAGNOSIS — D595 Paroxysmal nocturnal hemoglobinuria [Marchiafava-Micheli]: Secondary | ICD-10-CM | POA: Diagnosis not present

## 2016-07-07 DIAGNOSIS — F3342 Major depressive disorder, recurrent, in full remission: Secondary | ICD-10-CM | POA: Diagnosis not present

## 2016-07-07 DIAGNOSIS — F410 Panic disorder [episodic paroxysmal anxiety] without agoraphobia: Secondary | ICD-10-CM | POA: Diagnosis not present

## 2016-07-07 DIAGNOSIS — F900 Attention-deficit hyperactivity disorder, predominantly inattentive type: Secondary | ICD-10-CM | POA: Diagnosis not present

## 2016-07-10 DIAGNOSIS — M797 Fibromyalgia: Secondary | ICD-10-CM | POA: Diagnosis not present

## 2016-07-10 DIAGNOSIS — M25561 Pain in right knee: Secondary | ICD-10-CM | POA: Diagnosis not present

## 2016-07-10 DIAGNOSIS — M25562 Pain in left knee: Secondary | ICD-10-CM | POA: Diagnosis not present

## 2016-07-10 DIAGNOSIS — M25511 Pain in right shoulder: Secondary | ICD-10-CM | POA: Diagnosis not present

## 2016-07-15 DIAGNOSIS — M25511 Pain in right shoulder: Secondary | ICD-10-CM | POA: Diagnosis not present

## 2016-07-15 DIAGNOSIS — M25562 Pain in left knee: Secondary | ICD-10-CM | POA: Diagnosis not present

## 2016-07-15 DIAGNOSIS — M25561 Pain in right knee: Secondary | ICD-10-CM | POA: Diagnosis not present

## 2016-07-15 DIAGNOSIS — M797 Fibromyalgia: Secondary | ICD-10-CM | POA: Diagnosis not present

## 2016-07-16 DIAGNOSIS — M25562 Pain in left knee: Secondary | ICD-10-CM | POA: Diagnosis not present

## 2016-07-16 DIAGNOSIS — M797 Fibromyalgia: Secondary | ICD-10-CM | POA: Diagnosis not present

## 2016-07-16 DIAGNOSIS — M25511 Pain in right shoulder: Secondary | ICD-10-CM | POA: Diagnosis not present

## 2016-07-16 DIAGNOSIS — M25561 Pain in right knee: Secondary | ICD-10-CM | POA: Diagnosis not present

## 2016-07-17 DIAGNOSIS — J301 Allergic rhinitis due to pollen: Secondary | ICD-10-CM | POA: Diagnosis not present

## 2016-07-17 DIAGNOSIS — J3089 Other allergic rhinitis: Secondary | ICD-10-CM | POA: Diagnosis not present

## 2016-07-20 DIAGNOSIS — Z79899 Other long term (current) drug therapy: Secondary | ICD-10-CM | POA: Diagnosis not present

## 2016-07-20 DIAGNOSIS — M25562 Pain in left knee: Secondary | ICD-10-CM | POA: Diagnosis not present

## 2016-07-20 DIAGNOSIS — M25561 Pain in right knee: Secondary | ICD-10-CM | POA: Diagnosis not present

## 2016-07-20 DIAGNOSIS — M25511 Pain in right shoulder: Secondary | ICD-10-CM | POA: Diagnosis not present

## 2016-07-20 DIAGNOSIS — D595 Paroxysmal nocturnal hemoglobinuria [Marchiafava-Micheli]: Secondary | ICD-10-CM | POA: Diagnosis not present

## 2016-07-20 DIAGNOSIS — M797 Fibromyalgia: Secondary | ICD-10-CM | POA: Diagnosis not present

## 2016-07-21 DIAGNOSIS — J301 Allergic rhinitis due to pollen: Secondary | ICD-10-CM | POA: Diagnosis not present

## 2016-07-21 DIAGNOSIS — J3089 Other allergic rhinitis: Secondary | ICD-10-CM | POA: Diagnosis not present

## 2016-08-13 DIAGNOSIS — M25561 Pain in right knee: Secondary | ICD-10-CM | POA: Diagnosis not present

## 2016-08-13 DIAGNOSIS — M797 Fibromyalgia: Secondary | ICD-10-CM | POA: Diagnosis not present

## 2016-08-13 DIAGNOSIS — M25562 Pain in left knee: Secondary | ICD-10-CM | POA: Diagnosis not present

## 2016-08-13 DIAGNOSIS — M25511 Pain in right shoulder: Secondary | ICD-10-CM | POA: Diagnosis not present

## 2016-08-17 DIAGNOSIS — Z79899 Other long term (current) drug therapy: Secondary | ICD-10-CM | POA: Diagnosis not present

## 2016-08-17 DIAGNOSIS — D595 Paroxysmal nocturnal hemoglobinuria [Marchiafava-Micheli]: Secondary | ICD-10-CM | POA: Diagnosis not present

## 2016-08-18 DIAGNOSIS — J3089 Other allergic rhinitis: Secondary | ICD-10-CM | POA: Diagnosis not present

## 2016-08-18 DIAGNOSIS — M25511 Pain in right shoulder: Secondary | ICD-10-CM | POA: Diagnosis not present

## 2016-08-18 DIAGNOSIS — J301 Allergic rhinitis due to pollen: Secondary | ICD-10-CM | POA: Diagnosis not present

## 2016-08-18 DIAGNOSIS — M797 Fibromyalgia: Secondary | ICD-10-CM | POA: Diagnosis not present

## 2016-08-18 DIAGNOSIS — M25561 Pain in right knee: Secondary | ICD-10-CM | POA: Diagnosis not present

## 2016-08-18 DIAGNOSIS — M25562 Pain in left knee: Secondary | ICD-10-CM | POA: Diagnosis not present

## 2016-08-19 DIAGNOSIS — M25561 Pain in right knee: Secondary | ICD-10-CM | POA: Diagnosis not present

## 2016-08-19 DIAGNOSIS — M25511 Pain in right shoulder: Secondary | ICD-10-CM | POA: Diagnosis not present

## 2016-08-19 DIAGNOSIS — M797 Fibromyalgia: Secondary | ICD-10-CM | POA: Diagnosis not present

## 2016-08-19 DIAGNOSIS — M25562 Pain in left knee: Secondary | ICD-10-CM | POA: Diagnosis not present

## 2016-08-20 DIAGNOSIS — J3089 Other allergic rhinitis: Secondary | ICD-10-CM | POA: Diagnosis not present

## 2016-08-20 DIAGNOSIS — J301 Allergic rhinitis due to pollen: Secondary | ICD-10-CM | POA: Diagnosis not present

## 2016-08-31 DIAGNOSIS — D595 Paroxysmal nocturnal hemoglobinuria [Marchiafava-Micheli]: Secondary | ICD-10-CM | POA: Diagnosis not present

## 2016-08-31 DIAGNOSIS — Z79899 Other long term (current) drug therapy: Secondary | ICD-10-CM | POA: Diagnosis not present

## 2016-09-01 DIAGNOSIS — D595 Paroxysmal nocturnal hemoglobinuria [Marchiafava-Micheli]: Secondary | ICD-10-CM | POA: Diagnosis not present

## 2016-09-01 DIAGNOSIS — M170 Bilateral primary osteoarthritis of knee: Secondary | ICD-10-CM | POA: Diagnosis not present

## 2016-09-01 DIAGNOSIS — M7501 Adhesive capsulitis of right shoulder: Secondary | ICD-10-CM | POA: Diagnosis not present

## 2016-09-01 DIAGNOSIS — M3219 Other organ or system involvement in systemic lupus erythematosus: Secondary | ICD-10-CM | POA: Diagnosis not present

## 2016-09-01 DIAGNOSIS — M150 Primary generalized (osteo)arthritis: Secondary | ICD-10-CM | POA: Diagnosis not present

## 2016-09-01 DIAGNOSIS — E538 Deficiency of other specified B group vitamins: Secondary | ICD-10-CM | POA: Diagnosis not present

## 2016-09-01 DIAGNOSIS — M19011 Primary osteoarthritis, right shoulder: Secondary | ICD-10-CM | POA: Diagnosis not present

## 2016-09-01 DIAGNOSIS — M50322 Other cervical disc degeneration at C5-C6 level: Secondary | ICD-10-CM | POA: Diagnosis not present

## 2016-09-21 DIAGNOSIS — Z79899 Other long term (current) drug therapy: Secondary | ICD-10-CM | POA: Diagnosis not present

## 2016-09-21 DIAGNOSIS — Z5112 Encounter for antineoplastic immunotherapy: Secondary | ICD-10-CM | POA: Diagnosis not present

## 2016-09-21 DIAGNOSIS — D595 Paroxysmal nocturnal hemoglobinuria [Marchiafava-Micheli]: Secondary | ICD-10-CM | POA: Diagnosis not present

## 2016-09-24 DIAGNOSIS — J3089 Other allergic rhinitis: Secondary | ICD-10-CM | POA: Diagnosis not present

## 2016-09-24 DIAGNOSIS — J301 Allergic rhinitis due to pollen: Secondary | ICD-10-CM | POA: Diagnosis not present

## 2016-10-05 DIAGNOSIS — D595 Paroxysmal nocturnal hemoglobinuria [Marchiafava-Micheli]: Secondary | ICD-10-CM | POA: Diagnosis not present

## 2016-10-05 DIAGNOSIS — Z79899 Other long term (current) drug therapy: Secondary | ICD-10-CM | POA: Diagnosis not present

## 2016-10-12 DIAGNOSIS — M25562 Pain in left knee: Secondary | ICD-10-CM | POA: Diagnosis not present

## 2016-10-12 DIAGNOSIS — M797 Fibromyalgia: Secondary | ICD-10-CM | POA: Diagnosis not present

## 2016-10-12 DIAGNOSIS — M25561 Pain in right knee: Secondary | ICD-10-CM | POA: Diagnosis not present

## 2016-10-12 DIAGNOSIS — M25511 Pain in right shoulder: Secondary | ICD-10-CM | POA: Diagnosis not present

## 2016-10-13 DIAGNOSIS — J301 Allergic rhinitis due to pollen: Secondary | ICD-10-CM | POA: Diagnosis not present

## 2016-10-13 DIAGNOSIS — J3089 Other allergic rhinitis: Secondary | ICD-10-CM | POA: Diagnosis not present

## 2016-10-14 DIAGNOSIS — M25511 Pain in right shoulder: Secondary | ICD-10-CM | POA: Diagnosis not present

## 2016-10-14 DIAGNOSIS — M797 Fibromyalgia: Secondary | ICD-10-CM | POA: Diagnosis not present

## 2016-10-14 DIAGNOSIS — M25562 Pain in left knee: Secondary | ICD-10-CM | POA: Diagnosis not present

## 2016-10-14 DIAGNOSIS — M25561 Pain in right knee: Secondary | ICD-10-CM | POA: Diagnosis not present

## 2016-10-15 DIAGNOSIS — J3089 Other allergic rhinitis: Secondary | ICD-10-CM | POA: Diagnosis not present

## 2016-10-15 DIAGNOSIS — J301 Allergic rhinitis due to pollen: Secondary | ICD-10-CM | POA: Diagnosis not present

## 2016-10-19 DIAGNOSIS — Z79899 Other long term (current) drug therapy: Secondary | ICD-10-CM | POA: Diagnosis not present

## 2016-10-19 DIAGNOSIS — D595 Paroxysmal nocturnal hemoglobinuria [Marchiafava-Micheli]: Secondary | ICD-10-CM | POA: Diagnosis not present

## 2016-10-20 DIAGNOSIS — M25511 Pain in right shoulder: Secondary | ICD-10-CM | POA: Diagnosis not present

## 2016-10-20 DIAGNOSIS — M797 Fibromyalgia: Secondary | ICD-10-CM | POA: Diagnosis not present

## 2016-10-20 DIAGNOSIS — M25562 Pain in left knee: Secondary | ICD-10-CM | POA: Diagnosis not present

## 2016-10-20 DIAGNOSIS — J301 Allergic rhinitis due to pollen: Secondary | ICD-10-CM | POA: Diagnosis not present

## 2016-10-20 DIAGNOSIS — M25561 Pain in right knee: Secondary | ICD-10-CM | POA: Diagnosis not present

## 2016-10-20 DIAGNOSIS — J3089 Other allergic rhinitis: Secondary | ICD-10-CM | POA: Diagnosis not present

## 2016-10-21 DIAGNOSIS — M25511 Pain in right shoulder: Secondary | ICD-10-CM | POA: Diagnosis not present

## 2016-10-21 DIAGNOSIS — M797 Fibromyalgia: Secondary | ICD-10-CM | POA: Diagnosis not present

## 2016-10-21 DIAGNOSIS — M25562 Pain in left knee: Secondary | ICD-10-CM | POA: Diagnosis not present

## 2016-10-21 DIAGNOSIS — M25561 Pain in right knee: Secondary | ICD-10-CM | POA: Diagnosis not present

## 2016-10-26 DIAGNOSIS — M797 Fibromyalgia: Secondary | ICD-10-CM | POA: Diagnosis not present

## 2016-10-26 DIAGNOSIS — M25561 Pain in right knee: Secondary | ICD-10-CM | POA: Diagnosis not present

## 2016-10-26 DIAGNOSIS — M25562 Pain in left knee: Secondary | ICD-10-CM | POA: Diagnosis not present

## 2016-10-26 DIAGNOSIS — J301 Allergic rhinitis due to pollen: Secondary | ICD-10-CM | POA: Diagnosis not present

## 2016-10-26 DIAGNOSIS — M25511 Pain in right shoulder: Secondary | ICD-10-CM | POA: Diagnosis not present

## 2016-10-26 DIAGNOSIS — J3089 Other allergic rhinitis: Secondary | ICD-10-CM | POA: Diagnosis not present

## 2016-10-29 DIAGNOSIS — M25511 Pain in right shoulder: Secondary | ICD-10-CM | POA: Diagnosis not present

## 2016-10-29 DIAGNOSIS — M25562 Pain in left knee: Secondary | ICD-10-CM | POA: Diagnosis not present

## 2016-10-29 DIAGNOSIS — M25561 Pain in right knee: Secondary | ICD-10-CM | POA: Diagnosis not present

## 2016-10-29 DIAGNOSIS — M797 Fibromyalgia: Secondary | ICD-10-CM | POA: Diagnosis not present

## 2016-11-06 DIAGNOSIS — Z5112 Encounter for antineoplastic immunotherapy: Secondary | ICD-10-CM | POA: Diagnosis not present

## 2016-11-06 DIAGNOSIS — D599 Acquired hemolytic anemia, unspecified: Secondary | ICD-10-CM | POA: Diagnosis not present

## 2016-11-10 DIAGNOSIS — M25511 Pain in right shoulder: Secondary | ICD-10-CM | POA: Diagnosis not present

## 2016-11-10 DIAGNOSIS — M25561 Pain in right knee: Secondary | ICD-10-CM | POA: Diagnosis not present

## 2016-11-10 DIAGNOSIS — M25562 Pain in left knee: Secondary | ICD-10-CM | POA: Diagnosis not present

## 2016-11-10 DIAGNOSIS — M797 Fibromyalgia: Secondary | ICD-10-CM | POA: Diagnosis not present

## 2016-11-11 DIAGNOSIS — J3089 Other allergic rhinitis: Secondary | ICD-10-CM | POA: Diagnosis not present

## 2016-11-11 DIAGNOSIS — J4520 Mild intermittent asthma, uncomplicated: Secondary | ICD-10-CM | POA: Diagnosis not present

## 2016-11-11 DIAGNOSIS — J301 Allergic rhinitis due to pollen: Secondary | ICD-10-CM | POA: Diagnosis not present

## 2016-11-11 DIAGNOSIS — J019 Acute sinusitis, unspecified: Secondary | ICD-10-CM | POA: Diagnosis not present

## 2016-11-12 DIAGNOSIS — M797 Fibromyalgia: Secondary | ICD-10-CM | POA: Diagnosis not present

## 2016-11-12 DIAGNOSIS — M25511 Pain in right shoulder: Secondary | ICD-10-CM | POA: Diagnosis not present

## 2016-11-12 DIAGNOSIS — M25562 Pain in left knee: Secondary | ICD-10-CM | POA: Diagnosis not present

## 2016-11-12 DIAGNOSIS — M25561 Pain in right knee: Secondary | ICD-10-CM | POA: Diagnosis not present

## 2016-11-16 DIAGNOSIS — M25561 Pain in right knee: Secondary | ICD-10-CM | POA: Diagnosis not present

## 2016-11-16 DIAGNOSIS — M25562 Pain in left knee: Secondary | ICD-10-CM | POA: Diagnosis not present

## 2016-11-16 DIAGNOSIS — M25511 Pain in right shoulder: Secondary | ICD-10-CM | POA: Diagnosis not present

## 2016-11-16 DIAGNOSIS — M797 Fibromyalgia: Secondary | ICD-10-CM | POA: Diagnosis not present

## 2016-11-18 DIAGNOSIS — M797 Fibromyalgia: Secondary | ICD-10-CM | POA: Diagnosis not present

## 2016-11-18 DIAGNOSIS — M25511 Pain in right shoulder: Secondary | ICD-10-CM | POA: Diagnosis not present

## 2016-11-18 DIAGNOSIS — M25561 Pain in right knee: Secondary | ICD-10-CM | POA: Diagnosis not present

## 2016-11-18 DIAGNOSIS — M25562 Pain in left knee: Secondary | ICD-10-CM | POA: Diagnosis not present

## 2016-11-20 DIAGNOSIS — Z5112 Encounter for antineoplastic immunotherapy: Secondary | ICD-10-CM | POA: Diagnosis not present

## 2016-11-20 DIAGNOSIS — Z79899 Other long term (current) drug therapy: Secondary | ICD-10-CM | POA: Diagnosis not present

## 2016-11-20 DIAGNOSIS — D595 Paroxysmal nocturnal hemoglobinuria [Marchiafava-Micheli]: Secondary | ICD-10-CM | POA: Diagnosis not present

## 2016-11-23 DIAGNOSIS — M25562 Pain in left knee: Secondary | ICD-10-CM | POA: Diagnosis not present

## 2016-11-23 DIAGNOSIS — M25511 Pain in right shoulder: Secondary | ICD-10-CM | POA: Diagnosis not present

## 2016-11-23 DIAGNOSIS — M797 Fibromyalgia: Secondary | ICD-10-CM | POA: Diagnosis not present

## 2016-11-23 DIAGNOSIS — M25561 Pain in right knee: Secondary | ICD-10-CM | POA: Diagnosis not present

## 2016-11-27 DIAGNOSIS — M797 Fibromyalgia: Secondary | ICD-10-CM | POA: Diagnosis not present

## 2016-11-27 DIAGNOSIS — M25511 Pain in right shoulder: Secondary | ICD-10-CM | POA: Diagnosis not present

## 2016-11-27 DIAGNOSIS — M25562 Pain in left knee: Secondary | ICD-10-CM | POA: Diagnosis not present

## 2016-11-27 DIAGNOSIS — M25561 Pain in right knee: Secondary | ICD-10-CM | POA: Diagnosis not present

## 2016-12-01 DIAGNOSIS — M797 Fibromyalgia: Secondary | ICD-10-CM | POA: Diagnosis not present

## 2016-12-01 DIAGNOSIS — M25561 Pain in right knee: Secondary | ICD-10-CM | POA: Diagnosis not present

## 2016-12-01 DIAGNOSIS — M25562 Pain in left knee: Secondary | ICD-10-CM | POA: Diagnosis not present

## 2016-12-01 DIAGNOSIS — M25511 Pain in right shoulder: Secondary | ICD-10-CM | POA: Diagnosis not present

## 2016-12-02 DIAGNOSIS — R300 Dysuria: Secondary | ICD-10-CM | POA: Diagnosis not present

## 2016-12-03 DIAGNOSIS — M25511 Pain in right shoulder: Secondary | ICD-10-CM | POA: Diagnosis not present

## 2016-12-03 DIAGNOSIS — M25561 Pain in right knee: Secondary | ICD-10-CM | POA: Diagnosis not present

## 2016-12-03 DIAGNOSIS — M25562 Pain in left knee: Secondary | ICD-10-CM | POA: Diagnosis not present

## 2016-12-03 DIAGNOSIS — M797 Fibromyalgia: Secondary | ICD-10-CM | POA: Diagnosis not present

## 2016-12-07 DIAGNOSIS — D595 Paroxysmal nocturnal hemoglobinuria [Marchiafava-Micheli]: Secondary | ICD-10-CM | POA: Diagnosis not present

## 2016-12-07 DIAGNOSIS — Z79899 Other long term (current) drug therapy: Secondary | ICD-10-CM | POA: Diagnosis not present

## 2016-12-08 DIAGNOSIS — M25561 Pain in right knee: Secondary | ICD-10-CM | POA: Diagnosis not present

## 2016-12-08 DIAGNOSIS — M797 Fibromyalgia: Secondary | ICD-10-CM | POA: Diagnosis not present

## 2016-12-08 DIAGNOSIS — M25562 Pain in left knee: Secondary | ICD-10-CM | POA: Diagnosis not present

## 2016-12-08 DIAGNOSIS — M25511 Pain in right shoulder: Secondary | ICD-10-CM | POA: Diagnosis not present

## 2016-12-10 DIAGNOSIS — M25561 Pain in right knee: Secondary | ICD-10-CM | POA: Diagnosis not present

## 2016-12-10 DIAGNOSIS — M797 Fibromyalgia: Secondary | ICD-10-CM | POA: Diagnosis not present

## 2016-12-10 DIAGNOSIS — M25562 Pain in left knee: Secondary | ICD-10-CM | POA: Diagnosis not present

## 2016-12-10 DIAGNOSIS — M25511 Pain in right shoulder: Secondary | ICD-10-CM | POA: Diagnosis not present

## 2016-12-23 DIAGNOSIS — M25561 Pain in right knee: Secondary | ICD-10-CM | POA: Diagnosis not present

## 2016-12-23 DIAGNOSIS — M25562 Pain in left knee: Secondary | ICD-10-CM | POA: Diagnosis not present

## 2016-12-23 DIAGNOSIS — M797 Fibromyalgia: Secondary | ICD-10-CM | POA: Diagnosis not present

## 2016-12-23 DIAGNOSIS — M25511 Pain in right shoulder: Secondary | ICD-10-CM | POA: Diagnosis not present

## 2016-12-24 DIAGNOSIS — D595 Paroxysmal nocturnal hemoglobinuria [Marchiafava-Micheli]: Secondary | ICD-10-CM | POA: Diagnosis not present

## 2016-12-25 DIAGNOSIS — M25562 Pain in left knee: Secondary | ICD-10-CM | POA: Diagnosis not present

## 2016-12-25 DIAGNOSIS — M25561 Pain in right knee: Secondary | ICD-10-CM | POA: Diagnosis not present

## 2016-12-25 DIAGNOSIS — M25511 Pain in right shoulder: Secondary | ICD-10-CM | POA: Diagnosis not present

## 2016-12-25 DIAGNOSIS — M797 Fibromyalgia: Secondary | ICD-10-CM | POA: Diagnosis not present

## 2016-12-29 DIAGNOSIS — F3342 Major depressive disorder, recurrent, in full remission: Secondary | ICD-10-CM | POA: Diagnosis not present

## 2016-12-29 DIAGNOSIS — F411 Generalized anxiety disorder: Secondary | ICD-10-CM | POA: Diagnosis not present

## 2016-12-29 DIAGNOSIS — F900 Attention-deficit hyperactivity disorder, predominantly inattentive type: Secondary | ICD-10-CM | POA: Diagnosis not present

## 2016-12-30 DIAGNOSIS — M25562 Pain in left knee: Secondary | ICD-10-CM | POA: Diagnosis not present

## 2016-12-30 DIAGNOSIS — M25511 Pain in right shoulder: Secondary | ICD-10-CM | POA: Diagnosis not present

## 2016-12-30 DIAGNOSIS — M25561 Pain in right knee: Secondary | ICD-10-CM | POA: Diagnosis not present

## 2016-12-30 DIAGNOSIS — M797 Fibromyalgia: Secondary | ICD-10-CM | POA: Diagnosis not present

## 2016-12-31 DIAGNOSIS — M25562 Pain in left knee: Secondary | ICD-10-CM | POA: Diagnosis not present

## 2016-12-31 DIAGNOSIS — M25511 Pain in right shoulder: Secondary | ICD-10-CM | POA: Diagnosis not present

## 2016-12-31 DIAGNOSIS — M797 Fibromyalgia: Secondary | ICD-10-CM | POA: Diagnosis not present

## 2016-12-31 DIAGNOSIS — M25561 Pain in right knee: Secondary | ICD-10-CM | POA: Diagnosis not present

## 2017-01-05 DIAGNOSIS — M797 Fibromyalgia: Secondary | ICD-10-CM | POA: Diagnosis not present

## 2017-01-05 DIAGNOSIS — M25561 Pain in right knee: Secondary | ICD-10-CM | POA: Diagnosis not present

## 2017-01-05 DIAGNOSIS — M25511 Pain in right shoulder: Secondary | ICD-10-CM | POA: Diagnosis not present

## 2017-01-05 DIAGNOSIS — M25562 Pain in left knee: Secondary | ICD-10-CM | POA: Diagnosis not present

## 2017-01-08 DIAGNOSIS — M25511 Pain in right shoulder: Secondary | ICD-10-CM | POA: Diagnosis not present

## 2017-01-08 DIAGNOSIS — M797 Fibromyalgia: Secondary | ICD-10-CM | POA: Diagnosis not present

## 2017-01-08 DIAGNOSIS — M25561 Pain in right knee: Secondary | ICD-10-CM | POA: Diagnosis not present

## 2017-01-08 DIAGNOSIS — M25562 Pain in left knee: Secondary | ICD-10-CM | POA: Diagnosis not present

## 2017-01-11 DIAGNOSIS — D595 Paroxysmal nocturnal hemoglobinuria [Marchiafava-Micheli]: Secondary | ICD-10-CM | POA: Diagnosis not present

## 2017-01-11 DIAGNOSIS — Z79899 Other long term (current) drug therapy: Secondary | ICD-10-CM | POA: Diagnosis not present

## 2017-01-11 DIAGNOSIS — Z5112 Encounter for antineoplastic immunotherapy: Secondary | ICD-10-CM | POA: Diagnosis not present

## 2017-01-12 DIAGNOSIS — M25562 Pain in left knee: Secondary | ICD-10-CM | POA: Diagnosis not present

## 2017-01-12 DIAGNOSIS — J3089 Other allergic rhinitis: Secondary | ICD-10-CM | POA: Diagnosis not present

## 2017-01-12 DIAGNOSIS — J4520 Mild intermittent asthma, uncomplicated: Secondary | ICD-10-CM | POA: Diagnosis not present

## 2017-01-12 DIAGNOSIS — J301 Allergic rhinitis due to pollen: Secondary | ICD-10-CM | POA: Diagnosis not present

## 2017-01-12 DIAGNOSIS — H1045 Other chronic allergic conjunctivitis: Secondary | ICD-10-CM | POA: Diagnosis not present

## 2017-01-12 DIAGNOSIS — M797 Fibromyalgia: Secondary | ICD-10-CM | POA: Diagnosis not present

## 2017-01-12 DIAGNOSIS — M25561 Pain in right knee: Secondary | ICD-10-CM | POA: Diagnosis not present

## 2017-01-12 DIAGNOSIS — M25511 Pain in right shoulder: Secondary | ICD-10-CM | POA: Diagnosis not present

## 2017-01-14 DIAGNOSIS — Z0000 Encounter for general adult medical examination without abnormal findings: Secondary | ICD-10-CM | POA: Diagnosis not present

## 2017-01-14 DIAGNOSIS — E7800 Pure hypercholesterolemia, unspecified: Secondary | ICD-10-CM | POA: Diagnosis not present

## 2017-01-15 DIAGNOSIS — M25561 Pain in right knee: Secondary | ICD-10-CM | POA: Diagnosis not present

## 2017-01-15 DIAGNOSIS — M25562 Pain in left knee: Secondary | ICD-10-CM | POA: Diagnosis not present

## 2017-01-15 DIAGNOSIS — M25511 Pain in right shoulder: Secondary | ICD-10-CM | POA: Diagnosis not present

## 2017-01-15 DIAGNOSIS — M797 Fibromyalgia: Secondary | ICD-10-CM | POA: Diagnosis not present

## 2017-01-19 DIAGNOSIS — M797 Fibromyalgia: Secondary | ICD-10-CM | POA: Diagnosis not present

## 2017-01-19 DIAGNOSIS — M25511 Pain in right shoulder: Secondary | ICD-10-CM | POA: Diagnosis not present

## 2017-01-19 DIAGNOSIS — M25562 Pain in left knee: Secondary | ICD-10-CM | POA: Diagnosis not present

## 2017-01-19 DIAGNOSIS — M25561 Pain in right knee: Secondary | ICD-10-CM | POA: Diagnosis not present

## 2017-01-21 DIAGNOSIS — M25511 Pain in right shoulder: Secondary | ICD-10-CM | POA: Diagnosis not present

## 2017-01-21 DIAGNOSIS — M797 Fibromyalgia: Secondary | ICD-10-CM | POA: Diagnosis not present

## 2017-01-21 DIAGNOSIS — M25562 Pain in left knee: Secondary | ICD-10-CM | POA: Diagnosis not present

## 2017-01-21 DIAGNOSIS — M25561 Pain in right knee: Secondary | ICD-10-CM | POA: Diagnosis not present

## 2017-01-25 DIAGNOSIS — Z79899 Other long term (current) drug therapy: Secondary | ICD-10-CM | POA: Diagnosis not present

## 2017-01-25 DIAGNOSIS — D595 Paroxysmal nocturnal hemoglobinuria [Marchiafava-Micheli]: Secondary | ICD-10-CM | POA: Diagnosis not present

## 2017-01-25 DIAGNOSIS — Z5112 Encounter for antineoplastic immunotherapy: Secondary | ICD-10-CM | POA: Diagnosis not present

## 2017-01-26 DIAGNOSIS — M25511 Pain in right shoulder: Secondary | ICD-10-CM | POA: Diagnosis not present

## 2017-01-26 DIAGNOSIS — M797 Fibromyalgia: Secondary | ICD-10-CM | POA: Diagnosis not present

## 2017-01-26 DIAGNOSIS — M25561 Pain in right knee: Secondary | ICD-10-CM | POA: Diagnosis not present

## 2017-01-26 DIAGNOSIS — M25562 Pain in left knee: Secondary | ICD-10-CM | POA: Diagnosis not present

## 2017-01-28 DIAGNOSIS — K219 Gastro-esophageal reflux disease without esophagitis: Secondary | ICD-10-CM | POA: Diagnosis not present

## 2017-01-28 DIAGNOSIS — Z1211 Encounter for screening for malignant neoplasm of colon: Secondary | ICD-10-CM | POA: Diagnosis not present

## 2017-01-28 DIAGNOSIS — K625 Hemorrhage of anus and rectum: Secondary | ICD-10-CM | POA: Diagnosis not present

## 2017-01-28 DIAGNOSIS — Z800 Family history of malignant neoplasm of digestive organs: Secondary | ICD-10-CM | POA: Diagnosis not present

## 2017-01-29 DIAGNOSIS — M25561 Pain in right knee: Secondary | ICD-10-CM | POA: Diagnosis not present

## 2017-01-29 DIAGNOSIS — M25511 Pain in right shoulder: Secondary | ICD-10-CM | POA: Diagnosis not present

## 2017-01-29 DIAGNOSIS — M797 Fibromyalgia: Secondary | ICD-10-CM | POA: Diagnosis not present

## 2017-01-29 DIAGNOSIS — M25562 Pain in left knee: Secondary | ICD-10-CM | POA: Diagnosis not present

## 2017-02-04 DIAGNOSIS — M25562 Pain in left knee: Secondary | ICD-10-CM | POA: Diagnosis not present

## 2017-02-04 DIAGNOSIS — M25561 Pain in right knee: Secondary | ICD-10-CM | POA: Diagnosis not present

## 2017-02-04 DIAGNOSIS — M797 Fibromyalgia: Secondary | ICD-10-CM | POA: Diagnosis not present

## 2017-02-04 DIAGNOSIS — M25511 Pain in right shoulder: Secondary | ICD-10-CM | POA: Diagnosis not present

## 2017-02-08 DIAGNOSIS — D595 Paroxysmal nocturnal hemoglobinuria [Marchiafava-Micheli]: Secondary | ICD-10-CM | POA: Diagnosis not present

## 2017-02-08 DIAGNOSIS — Z79899 Other long term (current) drug therapy: Secondary | ICD-10-CM | POA: Diagnosis not present

## 2017-02-09 DIAGNOSIS — M797 Fibromyalgia: Secondary | ICD-10-CM | POA: Diagnosis not present

## 2017-02-09 DIAGNOSIS — M25511 Pain in right shoulder: Secondary | ICD-10-CM | POA: Diagnosis not present

## 2017-02-09 DIAGNOSIS — M25561 Pain in right knee: Secondary | ICD-10-CM | POA: Diagnosis not present

## 2017-02-09 DIAGNOSIS — M25562 Pain in left knee: Secondary | ICD-10-CM | POA: Diagnosis not present

## 2017-02-16 DIAGNOSIS — M25562 Pain in left knee: Secondary | ICD-10-CM | POA: Diagnosis not present

## 2017-02-16 DIAGNOSIS — M797 Fibromyalgia: Secondary | ICD-10-CM | POA: Diagnosis not present

## 2017-02-16 DIAGNOSIS — M25511 Pain in right shoulder: Secondary | ICD-10-CM | POA: Diagnosis not present

## 2017-02-16 DIAGNOSIS — M25561 Pain in right knee: Secondary | ICD-10-CM | POA: Diagnosis not present

## 2017-02-17 DIAGNOSIS — J301 Allergic rhinitis due to pollen: Secondary | ICD-10-CM | POA: Diagnosis not present

## 2017-02-17 DIAGNOSIS — J3089 Other allergic rhinitis: Secondary | ICD-10-CM | POA: Diagnosis not present

## 2017-02-22 DIAGNOSIS — Z79899 Other long term (current) drug therapy: Secondary | ICD-10-CM | POA: Diagnosis not present

## 2017-02-22 DIAGNOSIS — D595 Paroxysmal nocturnal hemoglobinuria [Marchiafava-Micheli]: Secondary | ICD-10-CM | POA: Diagnosis not present

## 2017-02-23 DIAGNOSIS — M25562 Pain in left knee: Secondary | ICD-10-CM | POA: Diagnosis not present

## 2017-02-23 DIAGNOSIS — J3089 Other allergic rhinitis: Secondary | ICD-10-CM | POA: Diagnosis not present

## 2017-02-23 DIAGNOSIS — J301 Allergic rhinitis due to pollen: Secondary | ICD-10-CM | POA: Diagnosis not present

## 2017-02-23 DIAGNOSIS — M25511 Pain in right shoulder: Secondary | ICD-10-CM | POA: Diagnosis not present

## 2017-02-23 DIAGNOSIS — M25561 Pain in right knee: Secondary | ICD-10-CM | POA: Diagnosis not present

## 2017-02-23 DIAGNOSIS — M797 Fibromyalgia: Secondary | ICD-10-CM | POA: Diagnosis not present

## 2017-03-08 ENCOUNTER — Encounter: Payer: Self-pay | Admitting: Nurse Practitioner

## 2017-03-08 ENCOUNTER — Ambulatory Visit (INDEPENDENT_AMBULATORY_CARE_PROVIDER_SITE_OTHER): Payer: BLUE CROSS/BLUE SHIELD | Admitting: Nurse Practitioner

## 2017-03-08 VITALS — BP 122/76 | HR 64 | Ht 67.0 in | Wt 180.0 lb

## 2017-03-08 DIAGNOSIS — J3089 Other allergic rhinitis: Secondary | ICD-10-CM | POA: Diagnosis not present

## 2017-03-08 DIAGNOSIS — Z01419 Encounter for gynecological examination (general) (routine) without abnormal findings: Secondary | ICD-10-CM | POA: Diagnosis not present

## 2017-03-08 DIAGNOSIS — Z0000 Encounter for general adult medical examination without abnormal findings: Secondary | ICD-10-CM | POA: Diagnosis not present

## 2017-03-08 DIAGNOSIS — D595 Paroxysmal nocturnal hemoglobinuria [Marchiafava-Micheli]: Secondary | ICD-10-CM | POA: Diagnosis not present

## 2017-03-08 DIAGNOSIS — J301 Allergic rhinitis due to pollen: Secondary | ICD-10-CM | POA: Diagnosis not present

## 2017-03-08 DIAGNOSIS — L930 Discoid lupus erythematosus: Secondary | ICD-10-CM
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
## 2017-03-08 NOTE — Patient Instructions (Signed)

## 2017-03-08 NOTE — Progress Notes (Signed)
Encounter reviewed by Dr. Jaretzi Droz Amundson C. Silva.  

## 2017-03-09 DIAGNOSIS — D595 Paroxysmal nocturnal hemoglobinuria [Marchiafava-Micheli]: Secondary | ICD-10-CM | POA: Diagnosis not present

## 2017-03-09 DIAGNOSIS — Z5111 Encounter for antineoplastic chemotherapy: Secondary | ICD-10-CM | POA: Diagnosis not present

## 2017-03-10 DIAGNOSIS — M25561 Pain in right knee: Secondary | ICD-10-CM | POA: Diagnosis not present

## 2017-03-10 DIAGNOSIS — M25511 Pain in right shoulder: Secondary | ICD-10-CM | POA: Diagnosis not present

## 2017-03-10 DIAGNOSIS — M25562 Pain in left knee: Secondary | ICD-10-CM | POA: Diagnosis not present

## 2017-03-10 DIAGNOSIS — M797 Fibromyalgia: Secondary | ICD-10-CM | POA: Diagnosis not present

## 2017-03-10 LAB — IPS PAP TEST WITH HPV

## 2017-03-11 DIAGNOSIS — J3089 Other allergic rhinitis: Secondary | ICD-10-CM | POA: Diagnosis not present

## 2017-03-11 DIAGNOSIS — J301 Allergic rhinitis due to pollen: Secondary | ICD-10-CM | POA: Diagnosis not present

## 2017-03-17 DIAGNOSIS — M25561 Pain in right knee: Secondary | ICD-10-CM | POA: Diagnosis not present

## 2017-03-17 DIAGNOSIS — M25562 Pain in left knee: Secondary | ICD-10-CM | POA: Diagnosis not present

## 2017-03-17 DIAGNOSIS — M797 Fibromyalgia: Secondary | ICD-10-CM | POA: Diagnosis not present

## 2017-03-17 DIAGNOSIS — M25511 Pain in right shoulder: Secondary | ICD-10-CM | POA: Diagnosis not present

## 2017-03-18 DIAGNOSIS — J301 Allergic rhinitis due to pollen: Secondary | ICD-10-CM | POA: Diagnosis not present

## 2017-03-18 DIAGNOSIS — J3089 Other allergic rhinitis: Secondary | ICD-10-CM | POA: Diagnosis not present

## 2017-03-22 DIAGNOSIS — D595 Paroxysmal nocturnal hemoglobinuria [Marchiafava-Micheli]: Secondary | ICD-10-CM | POA: Diagnosis not present

## 2017-03-22 DIAGNOSIS — Z79899 Other long term (current) drug therapy: Secondary | ICD-10-CM | POA: Diagnosis not present

## 2017-03-24 DIAGNOSIS — J301 Allergic rhinitis due to pollen: Secondary | ICD-10-CM | POA: Diagnosis not present

## 2017-03-24 DIAGNOSIS — J3089 Other allergic rhinitis: Secondary | ICD-10-CM | POA: Diagnosis not present

## 2017-03-25 DIAGNOSIS — Z780 Asymptomatic menopausal state: Secondary | ICD-10-CM | POA: Diagnosis not present

## 2017-03-25 DIAGNOSIS — Z803 Family history of malignant neoplasm of breast: Secondary | ICD-10-CM | POA: Diagnosis not present

## 2017-03-25 DIAGNOSIS — Z1231 Encounter for screening mammogram for malignant neoplasm of breast: Secondary | ICD-10-CM | POA: Diagnosis not present

## 2017-03-26 DIAGNOSIS — J301 Allergic rhinitis due to pollen: Secondary | ICD-10-CM | POA: Diagnosis not present

## 2017-03-26 DIAGNOSIS — J3089 Other allergic rhinitis: Secondary | ICD-10-CM | POA: Diagnosis not present

## 2017-03-29 DIAGNOSIS — Z800 Family history of malignant neoplasm of digestive organs: Secondary | ICD-10-CM | POA: Diagnosis not present

## 2017-03-29 DIAGNOSIS — D128 Benign neoplasm of rectum: Secondary | ICD-10-CM | POA: Diagnosis not present

## 2017-03-29 DIAGNOSIS — D127 Benign neoplasm of rectosigmoid junction: Secondary | ICD-10-CM | POA: Diagnosis not present

## 2017-03-29 DIAGNOSIS — Z1211 Encounter for screening for malignant neoplasm of colon: Secondary | ICD-10-CM | POA: Diagnosis not present

## 2017-03-29 DIAGNOSIS — K635 Polyp of colon: Secondary | ICD-10-CM | POA: Diagnosis not present

## 2017-03-31 DIAGNOSIS — J3089 Other allergic rhinitis: Secondary | ICD-10-CM | POA: Diagnosis not present

## 2017-03-31 DIAGNOSIS — M797 Fibromyalgia: Secondary | ICD-10-CM | POA: Diagnosis not present

## 2017-03-31 DIAGNOSIS — M25562 Pain in left knee: Secondary | ICD-10-CM | POA: Diagnosis not present

## 2017-03-31 DIAGNOSIS — M25561 Pain in right knee: Secondary | ICD-10-CM | POA: Diagnosis not present

## 2017-03-31 DIAGNOSIS — J301 Allergic rhinitis due to pollen: Secondary | ICD-10-CM | POA: Diagnosis not present

## 2017-03-31 DIAGNOSIS — M25511 Pain in right shoulder: Secondary | ICD-10-CM | POA: Diagnosis not present

## 2017-04-01 DIAGNOSIS — M329 Systemic lupus erythematosus, unspecified: Secondary | ICD-10-CM | POA: Diagnosis not present

## 2017-04-01 DIAGNOSIS — Z09 Encounter for follow-up examination after completed treatment for conditions other than malignant neoplasm: Secondary | ICD-10-CM | POA: Diagnosis not present

## 2017-04-01 DIAGNOSIS — M3501 Sicca syndrome with keratoconjunctivitis: Secondary | ICD-10-CM | POA: Diagnosis not present

## 2017-04-01 DIAGNOSIS — Z79899 Other long term (current) drug therapy: Secondary | ICD-10-CM | POA: Diagnosis not present

## 2017-04-02 ENCOUNTER — Telehealth: Payer: Self-pay | Admitting: Nurse Practitioner

## 2017-04-02 DIAGNOSIS — J3089 Other allergic rhinitis: Secondary | ICD-10-CM | POA: Diagnosis not present

## 2017-04-02 DIAGNOSIS — J301 Allergic rhinitis due to pollen: Secondary | ICD-10-CM | POA: Diagnosis not present

## 2017-04-02 NOTE — Telephone Encounter (Signed)
Please let pt know that BMD done on 03/25/17 shows T score at the spine  +1.30; right hip -0.10; left hip +0.00.  All these sites fall into the normal range.  Have her to continue with walking, gardening, and upper body weights.  She is to continue with calcium and Vit D support.  In labs from [facility] Everywhere I see no Vit D test results.  Next time she sees PCP have them to check Vit D.

## 2017-04-06 DIAGNOSIS — Z79899 Other long term (current) drug therapy: Secondary | ICD-10-CM | POA: Diagnosis not present

## 2017-04-06 DIAGNOSIS — D595 Paroxysmal nocturnal hemoglobinuria [Marchiafava-Micheli]: Secondary | ICD-10-CM | POA: Diagnosis not present

## 2017-04-06 NOTE — Telephone Encounter (Signed)
Patient notified of results.  She is excited about results. She does not remember having a Vit D checked with PCP. She will ask when she sees them next.  She has an appointment coming up soon.  Patient requests copy of report. Verbal request for Medical Records completed in [facility].  Encouraged patient to call with any questions, or Shirlyn GoltzPatty Grubb, FNP will review with her at her next AEX.  Closing encounter.

## 2017-04-07 ENCOUNTER — Encounter: Payer: Self-pay | Admitting: Nurse Practitioner

## 2017-04-07 DIAGNOSIS — M797 Fibromyalgia: Secondary | ICD-10-CM | POA: Diagnosis not present

## 2017-04-07 DIAGNOSIS — M25562 Pain in left knee: Secondary | ICD-10-CM | POA: Diagnosis not present

## 2017-04-07 DIAGNOSIS — M25511 Pain in right shoulder: Secondary | ICD-10-CM | POA: Diagnosis not present

## 2017-04-07 DIAGNOSIS — M25561 Pain in right knee: Secondary | ICD-10-CM | POA: Diagnosis not present

## 2017-04-09 DIAGNOSIS — J3089 Other allergic rhinitis: Secondary | ICD-10-CM | POA: Diagnosis not present

## 2017-04-09 DIAGNOSIS — J301 Allergic rhinitis due to pollen: Secondary | ICD-10-CM | POA: Diagnosis not present

## 2017-04-09 HISTORY — PX: OTHER SURGICAL HISTORY: SHX169

## 2017-04-16 DIAGNOSIS — Z5112 Encounter for antineoplastic immunotherapy: Secondary | ICD-10-CM | POA: Diagnosis not present

## 2017-04-16 DIAGNOSIS — D595 Paroxysmal nocturnal hemoglobinuria [Marchiafava-Micheli]: Secondary | ICD-10-CM | POA: Diagnosis not present

## 2017-04-16 DIAGNOSIS — Z79899 Other long term (current) drug therapy: Secondary | ICD-10-CM | POA: Diagnosis not present

## 2017-04-27 DIAGNOSIS — Z5181 Encounter for therapeutic drug level monitoring: Secondary | ICD-10-CM | POA: Diagnosis not present

## 2017-04-27 DIAGNOSIS — M3219 Other organ or system involvement in systemic lupus erythematosus: Secondary | ICD-10-CM | POA: Diagnosis not present

## 2017-04-27 DIAGNOSIS — Z9229 Personal history of other drug therapy: Secondary | ICD-10-CM | POA: Diagnosis not present

## 2017-04-27 DIAGNOSIS — D8989 Other specified disorders involving the immune mechanism, not elsewhere classified: Secondary | ICD-10-CM | POA: Diagnosis not present

## 2017-04-30 DIAGNOSIS — J3089 Other allergic rhinitis: Secondary | ICD-10-CM | POA: Diagnosis not present

## 2017-04-30 DIAGNOSIS — J301 Allergic rhinitis due to pollen: Secondary | ICD-10-CM | POA: Diagnosis not present

## 2017-05-03 DIAGNOSIS — D595 Paroxysmal nocturnal hemoglobinuria [Marchiafava-Micheli]: Secondary | ICD-10-CM | POA: Diagnosis not present

## 2017-05-03 DIAGNOSIS — Z79899 Other long term (current) drug therapy: Secondary | ICD-10-CM | POA: Diagnosis not present

## 2017-05-05 DIAGNOSIS — J301 Allergic rhinitis due to pollen: Secondary | ICD-10-CM | POA: Diagnosis not present

## 2017-05-05 DIAGNOSIS — J3089 Other allergic rhinitis: Secondary | ICD-10-CM | POA: Diagnosis not present

## 2017-05-06 DIAGNOSIS — L03115 Cellulitis of right lower limb: Secondary | ICD-10-CM | POA: Diagnosis not present

## 2017-05-06 DIAGNOSIS — L0390 Cellulitis, unspecified: Secondary | ICD-10-CM | POA: Diagnosis not present

## 2017-05-17 DIAGNOSIS — Z5112 Encounter for antineoplastic immunotherapy: Secondary | ICD-10-CM | POA: Diagnosis not present

## 2017-05-17 DIAGNOSIS — D595 Paroxysmal nocturnal hemoglobinuria [Marchiafava-Micheli]: Secondary | ICD-10-CM | POA: Diagnosis not present

## 2017-05-17 DIAGNOSIS — Z79899 Other long term (current) drug therapy: Secondary | ICD-10-CM | POA: Diagnosis not present

## 2017-05-18 DIAGNOSIS — J301 Allergic rhinitis due to pollen: Secondary | ICD-10-CM | POA: Diagnosis not present

## 2017-05-18 DIAGNOSIS — J3089 Other allergic rhinitis: Secondary | ICD-10-CM | POA: Diagnosis not present

## 2017-05-21 DIAGNOSIS — J3089 Other allergic rhinitis: Secondary | ICD-10-CM | POA: Diagnosis not present

## 2017-05-21 DIAGNOSIS — J301 Allergic rhinitis due to pollen: Secondary | ICD-10-CM | POA: Diagnosis not present

## 2017-05-27 DIAGNOSIS — J301 Allergic rhinitis due to pollen: Secondary | ICD-10-CM | POA: Diagnosis not present

## 2017-05-27 DIAGNOSIS — J3089 Other allergic rhinitis: Secondary | ICD-10-CM | POA: Diagnosis not present

## 2017-05-28 DIAGNOSIS — D595 Paroxysmal nocturnal hemoglobinuria [Marchiafava-Micheli]: Secondary | ICD-10-CM | POA: Diagnosis not present

## 2017-05-28 DIAGNOSIS — Z79899 Other long term (current) drug therapy: Secondary | ICD-10-CM | POA: Diagnosis not present

## 2017-06-08 ENCOUNTER — Telehealth: Payer: Self-pay | Admitting: Obstetrics & Gynecology

## 2017-06-08 NOTE — Telephone Encounter (Signed)
Left message on voicemail to call and reschedule cancelled appointment. Mail letter °

## 2017-06-14 DIAGNOSIS — D595 Paroxysmal nocturnal hemoglobinuria [Marchiafava-Micheli]: Secondary | ICD-10-CM | POA: Diagnosis not present

## 2017-06-16 DIAGNOSIS — J301 Allergic rhinitis due to pollen: Secondary | ICD-10-CM | POA: Diagnosis not present

## 2017-06-16 DIAGNOSIS — J3089 Other allergic rhinitis: Secondary | ICD-10-CM | POA: Diagnosis not present

## 2017-06-22 DIAGNOSIS — J301 Allergic rhinitis due to pollen: Secondary | ICD-10-CM | POA: Diagnosis not present

## 2017-06-22 DIAGNOSIS — F900 Attention-deficit hyperactivity disorder, predominantly inattentive type: Secondary | ICD-10-CM | POA: Diagnosis not present

## 2017-06-22 DIAGNOSIS — J3089 Other allergic rhinitis: Secondary | ICD-10-CM | POA: Diagnosis not present

## 2017-06-22 DIAGNOSIS — F411 Generalized anxiety disorder: Secondary | ICD-10-CM | POA: Diagnosis not present

## 2017-06-22 DIAGNOSIS — F3342 Major depressive disorder, recurrent, in full remission: Secondary | ICD-10-CM | POA: Diagnosis not present

## 2017-06-24 DIAGNOSIS — J301 Allergic rhinitis due to pollen: Secondary | ICD-10-CM | POA: Diagnosis not present

## 2017-06-24 DIAGNOSIS — J3089 Other allergic rhinitis: Secondary | ICD-10-CM | POA: Diagnosis not present

## 2017-06-29 DIAGNOSIS — Z79899 Other long term (current) drug therapy: Secondary | ICD-10-CM | POA: Diagnosis not present

## 2017-06-29 DIAGNOSIS — Z2913 Encounter for prophylactic Rho(D) immune globulin: Secondary | ICD-10-CM | POA: Diagnosis not present

## 2017-06-29 DIAGNOSIS — D595 Paroxysmal nocturnal hemoglobinuria [Marchiafava-Micheli]: Secondary | ICD-10-CM | POA: Diagnosis not present

## 2017-07-02 DIAGNOSIS — J3089 Other allergic rhinitis: Secondary | ICD-10-CM | POA: Diagnosis not present

## 2017-07-02 DIAGNOSIS — J301 Allergic rhinitis due to pollen: Secondary | ICD-10-CM | POA: Diagnosis not present

## 2017-07-13 DIAGNOSIS — D595 Paroxysmal nocturnal hemoglobinuria [Marchiafava-Micheli]: Secondary | ICD-10-CM | POA: Diagnosis not present

## 2017-07-13 DIAGNOSIS — Z79899 Other long term (current) drug therapy: Secondary | ICD-10-CM | POA: Diagnosis not present

## 2017-07-13 DIAGNOSIS — Z5181 Encounter for therapeutic drug level monitoring: Secondary | ICD-10-CM | POA: Diagnosis not present

## 2017-08-09 DIAGNOSIS — D595 Paroxysmal nocturnal hemoglobinuria [Marchiafava-Micheli]: Secondary | ICD-10-CM | POA: Diagnosis not present

## 2017-08-09 DIAGNOSIS — Z7982 Long term (current) use of aspirin: Secondary | ICD-10-CM | POA: Diagnosis not present

## 2017-08-09 DIAGNOSIS — M329 Systemic lupus erythematosus, unspecified: Secondary | ICD-10-CM | POA: Diagnosis not present

## 2017-08-23 DIAGNOSIS — D595 Paroxysmal nocturnal hemoglobinuria [Marchiafava-Micheli]: Secondary | ICD-10-CM | POA: Diagnosis not present

## 2017-09-06 DIAGNOSIS — Z79899 Other long term (current) drug therapy: Secondary | ICD-10-CM | POA: Diagnosis not present

## 2017-09-06 DIAGNOSIS — D595 Paroxysmal nocturnal hemoglobinuria [Marchiafava-Micheli]: Secondary | ICD-10-CM | POA: Diagnosis not present

## 2017-09-20 DIAGNOSIS — D595 Paroxysmal nocturnal hemoglobinuria [Marchiafava-Micheli]: Secondary | ICD-10-CM | POA: Diagnosis not present

## 2017-09-20 DIAGNOSIS — Z79899 Other long term (current) drug therapy: Secondary | ICD-10-CM | POA: Diagnosis not present

## 2017-09-23 DIAGNOSIS — M272 Inflammatory conditions of jaws: Secondary | ICD-10-CM | POA: Diagnosis not present

## 2017-10-04 DIAGNOSIS — Z79899 Other long term (current) drug therapy: Secondary | ICD-10-CM | POA: Diagnosis not present

## 2017-10-04 DIAGNOSIS — D595 Paroxysmal nocturnal hemoglobinuria [Marchiafava-Micheli]: Secondary | ICD-10-CM | POA: Diagnosis not present

## 2017-10-05 DIAGNOSIS — M2041 Other hammer toe(s) (acquired), right foot: Secondary | ICD-10-CM | POA: Diagnosis not present

## 2017-10-05 DIAGNOSIS — M2042 Other hammer toe(s) (acquired), left foot: Secondary | ICD-10-CM | POA: Diagnosis not present

## 2017-10-05 DIAGNOSIS — B353 Tinea pedis: Secondary | ICD-10-CM | POA: Diagnosis not present

## 2017-10-14 DIAGNOSIS — Z79899 Other long term (current) drug therapy: Secondary | ICD-10-CM | POA: Diagnosis not present

## 2017-10-14 DIAGNOSIS — M3501 Sicca syndrome with keratoconjunctivitis: Secondary | ICD-10-CM | POA: Diagnosis not present

## 2017-10-14 DIAGNOSIS — Z9225 Personal history of immunosupression therapy: Secondary | ICD-10-CM | POA: Diagnosis not present

## 2017-10-14 DIAGNOSIS — M3219 Other organ or system involvement in systemic lupus erythematosus: Secondary | ICD-10-CM | POA: Diagnosis not present

## 2017-10-15 DIAGNOSIS — Z79899 Other long term (current) drug therapy: Secondary | ICD-10-CM | POA: Diagnosis not present

## 2017-10-15 DIAGNOSIS — D595 Paroxysmal nocturnal hemoglobinuria [Marchiafava-Micheli]: Secondary | ICD-10-CM | POA: Diagnosis not present

## 2017-10-29 DIAGNOSIS — Z79899 Other long term (current) drug therapy: Secondary | ICD-10-CM | POA: Diagnosis not present

## 2017-10-29 DIAGNOSIS — D595 Paroxysmal nocturnal hemoglobinuria [Marchiafava-Micheli]: Secondary | ICD-10-CM | POA: Diagnosis not present

## 2017-11-15 DIAGNOSIS — D595 Paroxysmal nocturnal hemoglobinuria [Marchiafava-Micheli]: Secondary | ICD-10-CM | POA: Diagnosis not present

## 2017-11-30 DIAGNOSIS — D595 Paroxysmal nocturnal hemoglobinuria [Marchiafava-Micheli]: Secondary | ICD-10-CM | POA: Diagnosis not present

## 2017-11-30 DIAGNOSIS — Z79899 Other long term (current) drug therapy: Secondary | ICD-10-CM | POA: Diagnosis not present

## 2017-12-13 DIAGNOSIS — D595 Paroxysmal nocturnal hemoglobinuria [Marchiafava-Micheli]: Secondary | ICD-10-CM | POA: Diagnosis not present

## 2017-12-21 DIAGNOSIS — B353 Tinea pedis: Secondary | ICD-10-CM | POA: Diagnosis not present

## 2017-12-21 DIAGNOSIS — F410 Panic disorder [episodic paroxysmal anxiety] without agoraphobia: Secondary | ICD-10-CM | POA: Diagnosis not present

## 2017-12-21 DIAGNOSIS — F900 Attention-deficit hyperactivity disorder, predominantly inattentive type: Secondary | ICD-10-CM | POA: Diagnosis not present

## 2017-12-21 DIAGNOSIS — M2042 Other hammer toe(s) (acquired), left foot: Secondary | ICD-10-CM | POA: Diagnosis not present

## 2017-12-21 DIAGNOSIS — M2041 Other hammer toe(s) (acquired), right foot: Secondary | ICD-10-CM | POA: Diagnosis not present

## 2017-12-21 DIAGNOSIS — F3342 Major depressive disorder, recurrent, in full remission: Secondary | ICD-10-CM | POA: Diagnosis not present

## 2017-12-30 DIAGNOSIS — Z79899 Other long term (current) drug therapy: Secondary | ICD-10-CM | POA: Diagnosis not present

## 2017-12-30 DIAGNOSIS — D595 Paroxysmal nocturnal hemoglobinuria [Marchiafava-Micheli]: Secondary | ICD-10-CM | POA: Diagnosis not present

## 2018-01-10 DIAGNOSIS — Z79899 Other long term (current) drug therapy: Secondary | ICD-10-CM | POA: Diagnosis not present

## 2018-01-10 DIAGNOSIS — D595 Paroxysmal nocturnal hemoglobinuria [Marchiafava-Micheli]: Secondary | ICD-10-CM | POA: Diagnosis not present

## 2018-01-11 DIAGNOSIS — J301 Allergic rhinitis due to pollen: Secondary | ICD-10-CM | POA: Diagnosis not present

## 2018-01-11 DIAGNOSIS — J4520 Mild intermittent asthma, uncomplicated: Secondary | ICD-10-CM | POA: Diagnosis not present

## 2018-01-11 DIAGNOSIS — J3089 Other allergic rhinitis: Secondary | ICD-10-CM | POA: Diagnosis not present

## 2018-01-11 DIAGNOSIS — H1045 Other chronic allergic conjunctivitis: Secondary | ICD-10-CM | POA: Diagnosis not present

## 2018-01-18 DIAGNOSIS — J3089 Other allergic rhinitis: Secondary | ICD-10-CM | POA: Diagnosis not present

## 2018-01-18 DIAGNOSIS — J301 Allergic rhinitis due to pollen: Secondary | ICD-10-CM | POA: Diagnosis not present

## 2018-01-24 DIAGNOSIS — D595 Paroxysmal nocturnal hemoglobinuria [Marchiafava-Micheli]: Secondary | ICD-10-CM | POA: Diagnosis not present

## 2018-01-24 DIAGNOSIS — Z79899 Other long term (current) drug therapy: Secondary | ICD-10-CM | POA: Diagnosis not present

## 2018-02-02 DIAGNOSIS — J3089 Other allergic rhinitis: Secondary | ICD-10-CM | POA: Diagnosis not present

## 2018-02-02 DIAGNOSIS — J301 Allergic rhinitis due to pollen: Secondary | ICD-10-CM | POA: Diagnosis not present

## 2018-02-07 DIAGNOSIS — D595 Paroxysmal nocturnal hemoglobinuria [Marchiafava-Micheli]: Secondary | ICD-10-CM | POA: Diagnosis not present

## 2018-02-08 ENCOUNTER — Encounter: Payer: Self-pay | Admitting: Obstetrics and Gynecology

## 2018-02-08 DIAGNOSIS — G4730 Sleep apnea, unspecified: Secondary | ICD-10-CM | POA: Diagnosis not present

## 2018-02-08 DIAGNOSIS — Z0000 Encounter for general adult medical examination without abnormal findings: Secondary | ICD-10-CM | POA: Diagnosis not present

## 2018-02-08 DIAGNOSIS — J45909 Unspecified asthma, uncomplicated: Secondary | ICD-10-CM | POA: Diagnosis not present

## 2018-02-08 DIAGNOSIS — R739 Hyperglycemia, unspecified: Secondary | ICD-10-CM | POA: Diagnosis not present

## 2018-02-08 DIAGNOSIS — E7800 Pure hypercholesterolemia, unspecified: Secondary | ICD-10-CM | POA: Diagnosis not present

## 2018-02-08 DIAGNOSIS — R5383 Other fatigue: Secondary | ICD-10-CM | POA: Diagnosis not present

## 2018-02-10 DIAGNOSIS — J3089 Other allergic rhinitis: Secondary | ICD-10-CM | POA: Diagnosis not present

## 2018-02-10 DIAGNOSIS — J301 Allergic rhinitis due to pollen: Secondary | ICD-10-CM | POA: Diagnosis not present

## 2018-02-18 DIAGNOSIS — D595 Paroxysmal nocturnal hemoglobinuria [Marchiafava-Micheli]: Secondary | ICD-10-CM | POA: Diagnosis not present

## 2018-02-21 DIAGNOSIS — J301 Allergic rhinitis due to pollen: Secondary | ICD-10-CM | POA: Diagnosis not present

## 2018-02-21 DIAGNOSIS — J3089 Other allergic rhinitis: Secondary | ICD-10-CM | POA: Diagnosis not present

## 2018-02-24 DIAGNOSIS — J301 Allergic rhinitis due to pollen: Secondary | ICD-10-CM | POA: Diagnosis not present

## 2018-02-24 DIAGNOSIS — J3089 Other allergic rhinitis: Secondary | ICD-10-CM | POA: Diagnosis not present

## 2018-03-01 DIAGNOSIS — J3089 Other allergic rhinitis: Secondary | ICD-10-CM | POA: Diagnosis not present

## 2018-03-01 DIAGNOSIS — J301 Allergic rhinitis due to pollen: Secondary | ICD-10-CM | POA: Diagnosis not present

## 2018-03-04 DIAGNOSIS — D595 Paroxysmal nocturnal hemoglobinuria [Marchiafava-Micheli]: Secondary | ICD-10-CM | POA: Diagnosis not present

## 2018-03-04 DIAGNOSIS — Z79899 Other long term (current) drug therapy: Secondary | ICD-10-CM | POA: Diagnosis not present

## 2018-03-09 ENCOUNTER — Ambulatory Visit: Payer: BLUE CROSS/BLUE SHIELD | Admitting: Nurse Practitioner

## 2018-03-21 DIAGNOSIS — Z5111 Encounter for antineoplastic chemotherapy: Secondary | ICD-10-CM | POA: Diagnosis not present

## 2018-03-21 DIAGNOSIS — D595 Paroxysmal nocturnal hemoglobinuria [Marchiafava-Micheli]: Secondary | ICD-10-CM | POA: Diagnosis not present

## 2018-03-24 ENCOUNTER — Other Ambulatory Visit: Payer: Self-pay

## 2018-03-24 ENCOUNTER — Ambulatory Visit (INDEPENDENT_AMBULATORY_CARE_PROVIDER_SITE_OTHER): Payer: BLUE CROSS/BLUE SHIELD | Admitting: Obstetrics and Gynecology

## 2018-03-24 ENCOUNTER — Encounter: Payer: Self-pay | Admitting: Obstetrics and Gynecology

## 2018-03-24 VITALS — BP 120/78 | HR 84 | Resp 16 | Ht 66.5 in | Wt 169.0 lb

## 2018-03-24 DIAGNOSIS — J301 Allergic rhinitis due to pollen: Secondary | ICD-10-CM | POA: Diagnosis not present

## 2018-03-24 DIAGNOSIS — Z01419 Encounter for gynecological examination (general) (routine) without abnormal findings: Secondary | ICD-10-CM | POA: Diagnosis not present

## 2018-03-24 DIAGNOSIS — K602 Anal fissure, unspecified: Secondary | ICD-10-CM | POA: Diagnosis not present

## 2018-03-24 DIAGNOSIS — R6882 Decreased libido: Secondary | ICD-10-CM | POA: Diagnosis not present

## 2018-03-24 DIAGNOSIS — J3089 Other allergic rhinitis: Secondary | ICD-10-CM | POA: Diagnosis not present
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
## 2018-03-24 NOTE — Patient Instructions (Addendum)
EXERCISE AND DIET:  We recommended that you start or continue a regular exercise program for good health. Regular exercise means any activity that makes your heart beat faster and makes you sweat.  We recommend exercising at least 30 minutes per day at least 3 days a week, preferably 4 or 5.  We also recommend a diet low in fat and sugar.  Inactivity, poor dietary choices and obesity can cause diabetes, heart attack, stroke, and kidney damage, among others.    ALCOHOL AND SMOKING:  Women should limit their alcohol intake to no more than 7 drinks/beers/glasses of wine (combined, not each!) per week. Moderation of alcohol intake to this level decreases your risk of breast cancer and liver damage. And of course, no recreational drugs are part of a healthy lifestyle.  And absolutely no smoking or even second hand smoke. Most people know smoking can cause heart and lung diseases, but did you know it also contributes to weakening of your bones? Aging of your skin?  Yellowing of your teeth and nails?  CALCIUM AND VITAMIN D:  Adequate intake of calcium and Vitamin D are recommended.  The recommendations for exact amounts of these supplements seem to change often, but generally speaking 600 mg of calcium (either carbonate or citrate) and 800 units of Vitamin D per day seems prudent. Certain women may benefit from higher intake of Vitamin D.  If you are among these women, your doctor will have told you during your visit.    PAP SMEARS:  Pap smears, to check for cervical cancer or precancers,  have traditionally been done yearly, although recent scientific advances have shown that most women can have pap smears less often.  However, every woman still should have a physical exam from her gynecologist every year. It will include a breast check, inspection of the vulva and vagina to check for abnormal growths or skin changes, a visual exam of the cervix, and then an exam to evaluate the size and shape of the uterus and  ovaries.  And after 57 years of age, a rectal exam is indicated to check for rectal cancers. We will also provide age appropriate advice regarding health maintenance, like when you should have certain vaccines, screening for sexually transmitted diseases, bone density testing, colonoscopy, mammograms, etc.   MAMMOGRAMS:  All women over 40 years old should have a yearly mammogram. Many facilities now offer a "3D" mammogram, which may cost around $50 extra out of pocket. If possible,  we recommend you accept the option to have the 3D mammogram performed.  It both reduces the number of women who will be called back for extra views which then turn out to be normal, and it is better than the routine mammogram at detecting truly abnormal areas.    COLONOSCOPY:  Colonoscopy to screen for colon cancer is recommended for all women at age 50.  We know, you hate the idea of the prep.  We agree, BUT, having colon cancer and not knowing it is worse!!  Colon cancer so often starts as a polyp that can be seen and removed at colonscopy, which can quite literally save your life!  And if your first colonoscopy is normal and you have no family history of colon cancer, most women don't have to have it again for 10 years.  Once every ten years, you can do something that may end up saving your life, right?  We will be happy to help you get it scheduled when you are ready.    Be sure to check your insurance coverage so you understand how much it will cost.  It may be covered as a preventative service at no cost, but you should check your particular policy.      Anal Fissure, Adult An anal fissure is a small tear or crack in the skin around the anus. Bleeding from a fissure usually stops on its own within a few minutes. However, bleeding will often occur again with each bowel movement until the crack heals. What are the causes? This condition may be caused by:  Passing large, hard stool (feces).  Frequent  diarrhea.  Constipation.  Inflammatory bowel disease (Crohn disease or ulcerative colitis).  Infections.  Anal sex.  What are the signs or symptoms? Symptoms of this condition include:  Bleeding from the rectum.  Small amounts of blood seen on your stool, on toilet paper, or in the toilet after a bowel movement.  Painful bowel movements.  Itching or irritation around the anus.  How is this diagnosed? A health care provider may diagnose this condition by closely examining the anal area. An anal fissure can usually be seen with careful inspection. In some cases, a rectal exam may be performed, or a short tube (anoscope) may be used to examine the anal canal. How is this treated? Treatment for this condition may include:  Taking steps to avoid constipation. This may include making changes to your diet, such as increasing your intake of fiber or fluid.  Taking fiber supplements. These supplements can soften your stool to help make bowel movements easier. Your health care provider may also prescribe a stool softener if your stool is often hard.  Taking sitz baths. This may help to heal the tear.  Using medicated creams or ointments. These may be prescribed to lessen discomfort.  Follow these instructions at home: Eating and drinking  Avoid foods that may be constipating, such as bananas and dairy products.  Drink enough fluid to keep your urine clear or pale yellow.  Maintain a diet that is high in fruits, whole grains, and vegetables. General instructions  Keep the anal area as clean and dry as possible.  Take sitz baths as told by your health care provider. Do not use soap in the sitz baths.  Take over-the-counter and prescription medicines only as told by your health care provider.  Use creams or ointments only as told by your health care provider.  Keep all follow-up visits as told by your health care provider. This is important. Contact a health care provider  if:  You have more bleeding.  You have a fever.  You have diarrhea that is mixed with blood.  You continue to have pain.  Your problem is getting worse rather than better. This information is not intended to replace advice given to you by your health care provider. Make sure you discuss any questions you have with your health care provider. Document Released: 10/26/2005 Document Revised: 03/04/2016 Document Reviewed: 01/21/2015 Elsevier Interactive Patient Education  [employer].

## 2018-03-27 LAB — TESTT+TESTF+SHBG
Sex Hormone Binding: 78.6 nmol/L (ref 17.3–125.0)
Testosterone, Free: <0.2 pg/mL (ref 0.0–4.2)
Testosterone, total: 19.7 ng/dL

## 2018-03-28 DIAGNOSIS — J301 Allergic rhinitis due to pollen: Secondary | ICD-10-CM | POA: Diagnosis not present

## 2018-03-28 DIAGNOSIS — J3089 Other allergic rhinitis: Secondary | ICD-10-CM | POA: Diagnosis not present

## 2018-03-31 DIAGNOSIS — J3089 Other allergic rhinitis: Secondary | ICD-10-CM | POA: Diagnosis not present

## 2018-03-31 DIAGNOSIS — J301 Allergic rhinitis due to pollen: Secondary | ICD-10-CM | POA: Diagnosis not present

## 2018-04-06 DIAGNOSIS — D595 Paroxysmal nocturnal hemoglobinuria [Marchiafava-Micheli]: Secondary | ICD-10-CM | POA: Diagnosis not present

## 2018-04-07 DIAGNOSIS — J3089 Other allergic rhinitis: Secondary | ICD-10-CM | POA: Diagnosis not present

## 2018-04-07 DIAGNOSIS — J301 Allergic rhinitis due to pollen: Secondary | ICD-10-CM | POA: Diagnosis not present

## 2018-04-11 ENCOUNTER — Encounter: Payer: Self-pay | Admitting: Obstetrics and Gynecology

## 2018-04-11 DIAGNOSIS — R7303 Prediabetes: Secondary | ICD-10-CM | POA: Insufficient documentation

## 2018-04-11 DIAGNOSIS — J3089 Other allergic rhinitis: Secondary | ICD-10-CM | POA: Diagnosis not present

## 2018-04-11 DIAGNOSIS — J301 Allergic rhinitis due to pollen: Secondary | ICD-10-CM | POA: Diagnosis not present

## 2018-04-14 DIAGNOSIS — J301 Allergic rhinitis due to pollen: Secondary | ICD-10-CM | POA: Diagnosis not present

## 2018-04-14 DIAGNOSIS — J3089 Other allergic rhinitis: Secondary | ICD-10-CM | POA: Diagnosis not present

## 2018-04-15 DIAGNOSIS — Z1231 Encounter for screening mammogram for malignant neoplasm of breast: Secondary | ICD-10-CM | POA: Diagnosis not present

## 2018-04-15 DIAGNOSIS — Z803 Family history of malignant neoplasm of breast: Secondary | ICD-10-CM | POA: Diagnosis not present

## 2018-04-18 DIAGNOSIS — J3089 Other allergic rhinitis: Secondary | ICD-10-CM | POA: Diagnosis not present

## 2018-04-18 DIAGNOSIS — J301 Allergic rhinitis due to pollen: Secondary | ICD-10-CM | POA: Diagnosis not present

## 2018-04-19 DIAGNOSIS — D595 Paroxysmal nocturnal hemoglobinuria [Marchiafava-Micheli]: Secondary | ICD-10-CM | POA: Diagnosis not present

## 2018-04-21 DIAGNOSIS — J301 Allergic rhinitis due to pollen: Secondary | ICD-10-CM | POA: Diagnosis not present

## 2018-04-21 DIAGNOSIS — J3089 Other allergic rhinitis: Secondary | ICD-10-CM | POA: Diagnosis not present

## 2018-05-02 ENCOUNTER — Encounter: Payer: Self-pay | Admitting: Obstetrics and Gynecology

## 2018-05-10 ENCOUNTER — Encounter: Payer: Self-pay | Admitting: Obstetrics and Gynecology

## 2018-05-11 DIAGNOSIS — J301 Allergic rhinitis due to pollen: Secondary | ICD-10-CM | POA: Diagnosis not present

## 2018-05-11 DIAGNOSIS — J3089 Other allergic rhinitis: Secondary | ICD-10-CM | POA: Diagnosis not present

## 2018-05-11 NOTE — Telephone Encounter (Signed)
MMG reviewed and sighed my Dr Oscar LaJertson. Results to patient via My Chart. Encounter closed.

## 2018-05-16 DIAGNOSIS — D595 Paroxysmal nocturnal hemoglobinuria [Marchiafava-Micheli]: Secondary | ICD-10-CM | POA: Diagnosis not present

## 2018-05-18 DIAGNOSIS — J301 Allergic rhinitis due to pollen: Secondary | ICD-10-CM | POA: Diagnosis not present

## 2018-05-18 DIAGNOSIS — J3089 Other allergic rhinitis: Secondary | ICD-10-CM | POA: Diagnosis not present

## 2018-05-26 DIAGNOSIS — J301 Allergic rhinitis due to pollen: Secondary | ICD-10-CM | POA: Diagnosis not present

## 2018-05-26 DIAGNOSIS — J3089 Other allergic rhinitis: Secondary | ICD-10-CM | POA: Diagnosis not present

## 2018-05-30 DIAGNOSIS — D595 Paroxysmal nocturnal hemoglobinuria [Marchiafava-Micheli]: Secondary | ICD-10-CM | POA: Diagnosis not present

## 2018-05-30 DIAGNOSIS — R300 Dysuria: Secondary | ICD-10-CM | POA: Diagnosis not present

## 2018-06-01 DIAGNOSIS — J3089 Other allergic rhinitis: Secondary | ICD-10-CM | POA: Diagnosis not present

## 2018-06-01 DIAGNOSIS — J301 Allergic rhinitis due to pollen: Secondary | ICD-10-CM | POA: Diagnosis not present

## 2018-06-08 DIAGNOSIS — J3089 Other allergic rhinitis: Secondary | ICD-10-CM | POA: Diagnosis not present

## 2018-06-08 DIAGNOSIS — J301 Allergic rhinitis due to pollen: Secondary | ICD-10-CM | POA: Diagnosis not present

## 2018-06-13 DIAGNOSIS — J301 Allergic rhinitis due to pollen: Secondary | ICD-10-CM | POA: Diagnosis not present

## 2018-06-14 DIAGNOSIS — F3342 Major depressive disorder, recurrent, in full remission: Secondary | ICD-10-CM | POA: Diagnosis not present

## 2018-06-14 DIAGNOSIS — F900 Attention-deficit hyperactivity disorder, predominantly inattentive type: Secondary | ICD-10-CM | POA: Diagnosis not present

## 2018-06-15 DIAGNOSIS — J301 Allergic rhinitis due to pollen: Secondary | ICD-10-CM | POA: Diagnosis not present

## 2018-06-17 DIAGNOSIS — D595 Paroxysmal nocturnal hemoglobinuria [Marchiafava-Micheli]: Secondary | ICD-10-CM | POA: Diagnosis not present

## 2018-06-20 DIAGNOSIS — J301 Allergic rhinitis due to pollen: Secondary | ICD-10-CM | POA: Diagnosis not present

## 2018-06-24 DIAGNOSIS — J301 Allergic rhinitis due to pollen: Secondary | ICD-10-CM | POA: Diagnosis not present

## 2018-06-27 DIAGNOSIS — J301 Allergic rhinitis due to pollen: Secondary | ICD-10-CM | POA: Diagnosis not present

## 2018-07-01 DIAGNOSIS — J301 Allergic rhinitis due to pollen: Secondary | ICD-10-CM | POA: Diagnosis not present

## 2018-07-04 DIAGNOSIS — D595 Paroxysmal nocturnal hemoglobinuria [Marchiafava-Micheli]: Secondary | ICD-10-CM | POA: Diagnosis not present

## 2018-07-07 DIAGNOSIS — J301 Allergic rhinitis due to pollen: Secondary | ICD-10-CM | POA: Diagnosis not present

## 2018-07-15 DIAGNOSIS — J301 Allergic rhinitis due to pollen: Secondary | ICD-10-CM | POA: Diagnosis not present

## 2018-07-18 DIAGNOSIS — D595 Paroxysmal nocturnal hemoglobinuria [Marchiafava-Micheli]: Secondary | ICD-10-CM | POA: Diagnosis not present

## 2018-08-01 DIAGNOSIS — D595 Paroxysmal nocturnal hemoglobinuria [Marchiafava-Micheli]: Secondary | ICD-10-CM | POA: Diagnosis not present

## 2018-08-01 DIAGNOSIS — Z5111 Encounter for antineoplastic chemotherapy: Secondary | ICD-10-CM | POA: Diagnosis not present

## 2018-08-03 DIAGNOSIS — J301 Allergic rhinitis due to pollen: Secondary | ICD-10-CM | POA: Diagnosis not present

## 2018-08-05 DIAGNOSIS — J301 Allergic rhinitis due to pollen: Secondary | ICD-10-CM | POA: Diagnosis not present

## 2018-08-09 DIAGNOSIS — J301 Allergic rhinitis due to pollen: Secondary | ICD-10-CM | POA: Diagnosis not present

## 2018-08-15 DIAGNOSIS — D595 Paroxysmal nocturnal hemoglobinuria [Marchiafava-Micheli]: Secondary | ICD-10-CM | POA: Diagnosis not present

## 2018-08-17 DIAGNOSIS — J301 Allergic rhinitis due to pollen: Secondary | ICD-10-CM | POA: Diagnosis not present

## 2018-08-19 DIAGNOSIS — J301 Allergic rhinitis due to pollen: Secondary | ICD-10-CM | POA: Diagnosis not present

## 2018-09-12 DIAGNOSIS — D595 Paroxysmal nocturnal hemoglobinuria [Marchiafava-Micheli]: Secondary | ICD-10-CM | POA: Diagnosis not present

## 2018-09-12 DIAGNOSIS — Z7189 Other specified counseling: Secondary | ICD-10-CM | POA: Diagnosis not present

## 2018-09-16 DIAGNOSIS — J301 Allergic rhinitis due to pollen: Secondary | ICD-10-CM | POA: Diagnosis not present

## 2018-09-20 DIAGNOSIS — J301 Allergic rhinitis due to pollen: Secondary | ICD-10-CM | POA: Diagnosis not present

## 2018-09-21 DIAGNOSIS — M329 Systemic lupus erythematosus, unspecified: Secondary | ICD-10-CM | POA: Diagnosis not present

## 2018-09-21 DIAGNOSIS — D595 Paroxysmal nocturnal hemoglobinuria [Marchiafava-Micheli]: Secondary | ICD-10-CM | POA: Diagnosis not present

## 2018-09-21 DIAGNOSIS — F39 Unspecified mood [affective] disorder: Secondary | ICD-10-CM | POA: Diagnosis not present

## 2018-09-22 DIAGNOSIS — D595 Paroxysmal nocturnal hemoglobinuria [Marchiafava-Micheli]: Secondary | ICD-10-CM | POA: Diagnosis not present

## 2018-09-22 DIAGNOSIS — R5383 Other fatigue: Secondary | ICD-10-CM | POA: Diagnosis not present

## 2018-09-26 DIAGNOSIS — D595 Paroxysmal nocturnal hemoglobinuria [Marchiafava-Micheli]: Secondary | ICD-10-CM | POA: Diagnosis not present

## 2018-09-28 DIAGNOSIS — K219 Gastro-esophageal reflux disease without esophagitis: Secondary | ICD-10-CM | POA: Diagnosis not present

## 2018-09-28 DIAGNOSIS — D595 Paroxysmal nocturnal hemoglobinuria [Marchiafava-Micheli]: Secondary | ICD-10-CM | POA: Diagnosis not present

## 2018-09-28 DIAGNOSIS — R4189 Other symptoms and signs involving cognitive functions and awareness: Secondary | ICD-10-CM | POA: Diagnosis not present

## 2018-09-28 DIAGNOSIS — J3089 Other allergic rhinitis: Secondary | ICD-10-CM | POA: Diagnosis not present

## 2018-09-28 DIAGNOSIS — F39 Unspecified mood [affective] disorder: Secondary | ICD-10-CM | POA: Diagnosis not present

## 2018-09-28 DIAGNOSIS — J301 Allergic rhinitis due to pollen: Secondary | ICD-10-CM | POA: Diagnosis not present

## 2018-09-30 DIAGNOSIS — J301 Allergic rhinitis due to pollen: Secondary | ICD-10-CM | POA: Diagnosis not present

## 2018-10-05 DIAGNOSIS — J301 Allergic rhinitis due to pollen: Secondary | ICD-10-CM | POA: Diagnosis not present

## 2018-10-13 DIAGNOSIS — M3219 Other organ or system involvement in systemic lupus erythematosus: Secondary | ICD-10-CM | POA: Diagnosis not present

## 2018-10-13 DIAGNOSIS — D8989 Other specified disorders involving the immune mechanism, not elsewhere classified: Secondary | ICD-10-CM | POA: Diagnosis not present

## 2018-10-13 DIAGNOSIS — M329 Systemic lupus erythematosus, unspecified: Secondary | ICD-10-CM | POA: Diagnosis not present

## 2018-10-13 DIAGNOSIS — Z5181 Encounter for therapeutic drug level monitoring: Secondary | ICD-10-CM | POA: Diagnosis not present

## 2018-10-13 DIAGNOSIS — M797 Fibromyalgia: Secondary | ICD-10-CM | POA: Diagnosis not present

## 2018-10-13 DIAGNOSIS — D595 Paroxysmal nocturnal hemoglobinuria [Marchiafava-Micheli]: Secondary | ICD-10-CM | POA: Diagnosis not present

## 2018-10-13 DIAGNOSIS — E538 Deficiency of other specified B group vitamins: Secondary | ICD-10-CM | POA: Diagnosis not present

## 2018-10-14 DIAGNOSIS — J301 Allergic rhinitis due to pollen: Secondary | ICD-10-CM | POA: Diagnosis not present

## 2018-10-17 DIAGNOSIS — J301 Allergic rhinitis due to pollen: Secondary | ICD-10-CM | POA: Diagnosis not present

## 2018-10-25 DIAGNOSIS — J301 Allergic rhinitis due to pollen: Secondary | ICD-10-CM | POA: Diagnosis not present

## 2018-10-28 DIAGNOSIS — J301 Allergic rhinitis due to pollen: Secondary | ICD-10-CM | POA: Diagnosis not present

## 2018-11-15 ENCOUNTER — Other Ambulatory Visit: Payer: Self-pay

## 2018-11-15 ENCOUNTER — Encounter (HOSPITAL_COMMUNITY): Payer: Self-pay | Admitting: Emergency Medicine

## 2018-11-15 ENCOUNTER — Ambulatory Visit (HOSPITAL_COMMUNITY)
Admission: EM | Admit: 2018-11-15 | Discharge: 2018-11-15 | Disposition: A | Discharge status: Home / Self Care | Payer: BLUE CROSS/BLUE SHIELD | Attending: Family Medicine | Admitting: Family Medicine

## 2018-11-15 DIAGNOSIS — J22 Unspecified acute lower respiratory infection: Secondary | ICD-10-CM | POA: Insufficient documentation

## 2018-11-15 MED ORDER — CLARITHROMYCIN 500 MG PO TABS: 500.0000 mg | ORAL_TABLET | Freq: Two times a day (BID) | ORAL | 0 refills | Status: DC
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
## 2018-11-15 NOTE — Discharge Instructions (Addendum)
Take the biaxin as directed Push fluids See your PCP in follow up

## 2018-11-15 NOTE — ED Triage Notes (Signed)
Pt complains of nasal congestion and a productive cough that started at Christmas.  Pt has been taking Sudafed and Mucinex with little relief.

## 2018-11-21 DIAGNOSIS — D595 Paroxysmal nocturnal hemoglobinuria [Marchiafava-Micheli]: Secondary | ICD-10-CM | POA: Diagnosis not present

## 2018-11-23 DIAGNOSIS — H2513 Age-related nuclear cataract, bilateral: Secondary | ICD-10-CM | POA: Diagnosis not present

## 2018-11-23 DIAGNOSIS — M3210 Systemic lupus erythematosus, organ or system involvement unspecified: Secondary | ICD-10-CM | POA: Diagnosis not present

## 2018-11-23 DIAGNOSIS — Z09 Encounter for follow-up examination after completed treatment for conditions other than malignant neoplasm: Secondary | ICD-10-CM | POA: Diagnosis not present

## 2018-11-23 DIAGNOSIS — Z79899 Other long term (current) drug therapy: Secondary | ICD-10-CM | POA: Diagnosis not present

## 2018-11-24 DIAGNOSIS — D595 Paroxysmal nocturnal hemoglobinuria [Marchiafava-Micheli]: Secondary | ICD-10-CM | POA: Diagnosis not present

## 2018-11-25 DIAGNOSIS — F900 Attention-deficit hyperactivity disorder, predominantly inattentive type: Secondary | ICD-10-CM | POA: Diagnosis not present

## 2018-11-25 DIAGNOSIS — F3342 Major depressive disorder, recurrent, in full remission: Secondary | ICD-10-CM | POA: Diagnosis not present

## 2019-01-16 DIAGNOSIS — D595 Paroxysmal nocturnal hemoglobinuria [Marchiafava-Micheli]: Secondary | ICD-10-CM | POA: Diagnosis not present

## 2019-01-18 DIAGNOSIS — J4520 Mild intermittent asthma, uncomplicated: Secondary | ICD-10-CM | POA: Diagnosis not present

## 2019-01-18 DIAGNOSIS — J301 Allergic rhinitis due to pollen: Secondary | ICD-10-CM | POA: Diagnosis not present

## 2019-01-18 DIAGNOSIS — H1045 Other chronic allergic conjunctivitis: Secondary | ICD-10-CM | POA: Diagnosis not present

## 2019-01-18 DIAGNOSIS — L501 Idiopathic urticaria: Secondary | ICD-10-CM | POA: Diagnosis not present

## 2019-01-18 DIAGNOSIS — J3089 Other allergic rhinitis: Secondary | ICD-10-CM | POA: Diagnosis not present

## 2019-01-20 DIAGNOSIS — J301 Allergic rhinitis due to pollen: Secondary | ICD-10-CM | POA: Diagnosis not present

## 2019-01-27 DIAGNOSIS — J301 Allergic rhinitis due to pollen: Secondary | ICD-10-CM | POA: Diagnosis not present

## 2019-02-01 DIAGNOSIS — J301 Allergic rhinitis due to pollen: Secondary | ICD-10-CM | POA: Diagnosis not present

## 2019-02-03 DIAGNOSIS — J301 Allergic rhinitis due to pollen: Secondary | ICD-10-CM | POA: Diagnosis not present

## 2019-02-10 DIAGNOSIS — J301 Allergic rhinitis due to pollen: Secondary | ICD-10-CM | POA: Diagnosis not present

## 2019-02-15 DIAGNOSIS — J301 Allergic rhinitis due to pollen: Secondary | ICD-10-CM | POA: Diagnosis not present

## 2019-02-28 DIAGNOSIS — J301 Allergic rhinitis due to pollen: Secondary | ICD-10-CM | POA: Diagnosis not present

## 2019-03-03 DIAGNOSIS — J301 Allergic rhinitis due to pollen: Secondary | ICD-10-CM | POA: Diagnosis not present

## 2019-03-08 DIAGNOSIS — J301 Allergic rhinitis due to pollen: Secondary | ICD-10-CM | POA: Diagnosis not present

## 2019-03-10 DIAGNOSIS — J301 Allergic rhinitis due to pollen: Secondary | ICD-10-CM | POA: Diagnosis not present

## 2019-03-13 DIAGNOSIS — Z79899 Other long term (current) drug therapy: Secondary | ICD-10-CM | POA: Diagnosis not present

## 2019-03-13 DIAGNOSIS — E538 Deficiency of other specified B group vitamins: Secondary | ICD-10-CM | POA: Diagnosis not present

## 2019-03-13 DIAGNOSIS — L930 Discoid lupus erythematosus: Secondary | ICD-10-CM | POA: Diagnosis not present

## 2019-03-13 DIAGNOSIS — D595 Paroxysmal nocturnal hemoglobinuria [Marchiafava-Micheli]: Secondary | ICD-10-CM | POA: Diagnosis not present

## 2019-03-14 DIAGNOSIS — J301 Allergic rhinitis due to pollen: Secondary | ICD-10-CM | POA: Diagnosis not present

## 2019-03-22 DIAGNOSIS — J301 Allergic rhinitis due to pollen: Secondary | ICD-10-CM | POA: Diagnosis not present

## 2019-03-28 DIAGNOSIS — J301 Allergic rhinitis due to pollen: Secondary | ICD-10-CM | POA: Diagnosis not present

## 2019-04-04 DIAGNOSIS — J301 Allergic rhinitis due to pollen: Secondary | ICD-10-CM | POA: Diagnosis not present
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
## 2019-04-13 ENCOUNTER — Ambulatory Visit (INDEPENDENT_AMBULATORY_CARE_PROVIDER_SITE_OTHER): Payer: BC Managed Care – PPO | Admitting: Obstetrics and Gynecology

## 2019-04-13 ENCOUNTER — Other Ambulatory Visit: Payer: Self-pay

## 2019-04-13 ENCOUNTER — Encounter: Payer: Self-pay | Admitting: Obstetrics and Gynecology

## 2019-04-13 VITALS — BP 122/78 | HR 80 | Temp 97.7°F | Ht 66.14 in | Wt 175.4 lb

## 2019-04-13 DIAGNOSIS — R6882 Decreased libido: Secondary | ICD-10-CM

## 2019-04-13 DIAGNOSIS — Z01419 Encounter for gynecological examination (general) (routine) without abnormal findings: Secondary | ICD-10-CM | POA: Diagnosis not present

## 2019-04-13 DIAGNOSIS — J301 Allergic rhinitis due to pollen: Secondary | ICD-10-CM | POA: Diagnosis not present

## 2019-04-13 NOTE — Patient Instructions (Signed)

## 2019-04-19 DIAGNOSIS — J301 Allergic rhinitis due to pollen: Secondary | ICD-10-CM | POA: Diagnosis not present

## 2019-04-25 DIAGNOSIS — M25511 Pain in right shoulder: Secondary | ICD-10-CM | POA: Diagnosis not present

## 2019-04-25 DIAGNOSIS — M79672 Pain in left foot: Secondary | ICD-10-CM | POA: Diagnosis not present

## 2019-04-25 DIAGNOSIS — M79671 Pain in right foot: Secondary | ICD-10-CM | POA: Diagnosis not present

## 2019-04-25 DIAGNOSIS — M1711 Unilateral primary osteoarthritis, right knee: Secondary | ICD-10-CM | POA: Diagnosis not present

## 2019-04-25 DIAGNOSIS — M1712 Unilateral primary osteoarthritis, left knee: Secondary | ICD-10-CM | POA: Diagnosis not present

## 2019-04-25 DIAGNOSIS — M3219 Other organ or system involvement in systemic lupus erythematosus: Secondary | ICD-10-CM | POA: Diagnosis not present

## 2019-04-25 DIAGNOSIS — M170 Bilateral primary osteoarthritis of knee: Secondary | ICD-10-CM | POA: Diagnosis not present

## 2019-04-28 ENCOUNTER — Encounter: Payer: Self-pay | Admitting: Obstetrics and Gynecology

## 2019-04-28 DIAGNOSIS — Z1231 Encounter for screening mammogram for malignant neoplasm of breast: Secondary | ICD-10-CM | POA: Diagnosis not present

## 2019-04-28 DIAGNOSIS — Z803 Family history of malignant neoplasm of breast: Secondary | ICD-10-CM | POA: Diagnosis not present

## 2019-04-28 DIAGNOSIS — J301 Allergic rhinitis due to pollen: Secondary | ICD-10-CM | POA: Diagnosis not present

## 2019-05-04 DIAGNOSIS — J301 Allergic rhinitis due to pollen: Secondary | ICD-10-CM | POA: Diagnosis not present

## 2019-05-08 DIAGNOSIS — Z79899 Other long term (current) drug therapy: Secondary | ICD-10-CM | POA: Diagnosis not present

## 2019-05-08 DIAGNOSIS — D595 Paroxysmal nocturnal hemoglobinuria [Marchiafava-Micheli]: Secondary | ICD-10-CM | POA: Diagnosis not present

## 2019-05-17 DIAGNOSIS — J301 Allergic rhinitis due to pollen: Secondary | ICD-10-CM | POA: Diagnosis not present

## 2019-05-19 DIAGNOSIS — F900 Attention-deficit hyperactivity disorder, predominantly inattentive type: Secondary | ICD-10-CM | POA: Diagnosis not present

## 2019-05-19 DIAGNOSIS — F3342 Major depressive disorder, recurrent, in full remission: Secondary | ICD-10-CM | POA: Diagnosis not present

## 2019-05-19 DIAGNOSIS — F341 Dysthymic disorder: Secondary | ICD-10-CM | POA: Diagnosis not present

## 2019-05-19 DIAGNOSIS — F410 Panic disorder [episodic paroxysmal anxiety] without agoraphobia: Secondary | ICD-10-CM | POA: Diagnosis not present

## 2019-06-01 DIAGNOSIS — J301 Allergic rhinitis due to pollen: Secondary | ICD-10-CM | POA: Diagnosis not present

## 2019-06-08 DIAGNOSIS — J301 Allergic rhinitis due to pollen: Secondary | ICD-10-CM | POA: Diagnosis not present

## 2019-06-19 DIAGNOSIS — J301 Allergic rhinitis due to pollen: Secondary | ICD-10-CM | POA: Diagnosis not present

## 2019-06-22 DIAGNOSIS — J3089 Other allergic rhinitis: Secondary | ICD-10-CM | POA: Diagnosis not present

## 2019-06-22 DIAGNOSIS — J301 Allergic rhinitis due to pollen: Secondary | ICD-10-CM | POA: Diagnosis not present

## 2019-07-03 DIAGNOSIS — Z79899 Other long term (current) drug therapy: Secondary | ICD-10-CM | POA: Diagnosis not present

## 2019-07-03 DIAGNOSIS — D595 Paroxysmal nocturnal hemoglobinuria [Marchiafava-Micheli]: Secondary | ICD-10-CM | POA: Diagnosis not present

## 2019-07-06 DIAGNOSIS — J301 Allergic rhinitis due to pollen: Secondary | ICD-10-CM | POA: Diagnosis not present

## 2019-07-14 DIAGNOSIS — J301 Allergic rhinitis due to pollen: Secondary | ICD-10-CM | POA: Diagnosis not present

## 2019-07-26 DIAGNOSIS — J301 Allergic rhinitis due to pollen: Secondary | ICD-10-CM | POA: Diagnosis not present

## 2019-07-27 DIAGNOSIS — M1712 Unilateral primary osteoarthritis, left knee: Secondary | ICD-10-CM | POA: Diagnosis not present

## 2019-07-27 DIAGNOSIS — M3219 Other organ or system involvement in systemic lupus erythematosus: Secondary | ICD-10-CM | POA: Diagnosis not present

## 2019-07-27 DIAGNOSIS — M25561 Pain in right knee: Secondary | ICD-10-CM | POA: Diagnosis not present

## 2019-07-27 DIAGNOSIS — M1711 Unilateral primary osteoarthritis, right knee: Secondary | ICD-10-CM | POA: Diagnosis not present

## 2019-07-27 DIAGNOSIS — Z79899 Other long term (current) drug therapy: Secondary | ICD-10-CM | POA: Diagnosis not present

## 2019-07-27 DIAGNOSIS — M8949 Other hypertrophic osteoarthropathy, multiple sites: Secondary | ICD-10-CM | POA: Diagnosis not present

## 2019-07-27 DIAGNOSIS — M25562 Pain in left knee: Secondary | ICD-10-CM | POA: Diagnosis not present

## 2019-07-27 DIAGNOSIS — Z23 Encounter for immunization: Secondary | ICD-10-CM | POA: Diagnosis not present

## 2019-08-01 DIAGNOSIS — J301 Allergic rhinitis due to pollen: Secondary | ICD-10-CM | POA: Diagnosis not present

## 2019-08-14 DIAGNOSIS — J301 Allergic rhinitis due to pollen: Secondary | ICD-10-CM | POA: Diagnosis not present

## 2019-08-18 DIAGNOSIS — J301 Allergic rhinitis due to pollen: Secondary | ICD-10-CM | POA: Diagnosis not present

## 2019-08-23 DIAGNOSIS — J301 Allergic rhinitis due to pollen: Secondary | ICD-10-CM | POA: Diagnosis not present

## 2019-08-25 DIAGNOSIS — J301 Allergic rhinitis due to pollen: Secondary | ICD-10-CM | POA: Diagnosis not present

## 2019-08-28 DIAGNOSIS — D595 Paroxysmal nocturnal hemoglobinuria [Marchiafava-Micheli]: Secondary | ICD-10-CM | POA: Diagnosis not present

## 2019-08-28 DIAGNOSIS — Z79899 Other long term (current) drug therapy: Secondary | ICD-10-CM | POA: Diagnosis not present

## 2019-08-30 DIAGNOSIS — J301 Allergic rhinitis due to pollen: Secondary | ICD-10-CM | POA: Diagnosis not present

## 2019-08-30 DIAGNOSIS — J3089 Other allergic rhinitis: Secondary | ICD-10-CM | POA: Diagnosis not present

## 2019-09-01 DIAGNOSIS — J3089 Other allergic rhinitis: Secondary | ICD-10-CM | POA: Diagnosis not present

## 2019-09-01 DIAGNOSIS — J301 Allergic rhinitis due to pollen: Secondary | ICD-10-CM | POA: Diagnosis not present

## 2019-09-06 DIAGNOSIS — J301 Allergic rhinitis due to pollen: Secondary | ICD-10-CM | POA: Diagnosis not present

## 2019-09-14 DIAGNOSIS — M25562 Pain in left knee: Secondary | ICD-10-CM | POA: Diagnosis not present

## 2019-09-14 DIAGNOSIS — J301 Allergic rhinitis due to pollen: Secondary | ICD-10-CM | POA: Diagnosis not present

## 2019-09-14 DIAGNOSIS — M79671 Pain in right foot: Secondary | ICD-10-CM | POA: Diagnosis not present

## 2019-09-14 DIAGNOSIS — M25611 Stiffness of right shoulder, not elsewhere classified: Secondary | ICD-10-CM | POA: Diagnosis not present

## 2019-09-14 DIAGNOSIS — J3089 Other allergic rhinitis: Secondary | ICD-10-CM | POA: Diagnosis not present

## 2019-09-14 DIAGNOSIS — M25561 Pain in right knee: Secondary | ICD-10-CM | POA: Diagnosis not present

## 2019-09-18 DIAGNOSIS — M25611 Stiffness of right shoulder, not elsewhere classified: Secondary | ICD-10-CM | POA: Diagnosis not present

## 2019-09-18 DIAGNOSIS — M25561 Pain in right knee: Secondary | ICD-10-CM | POA: Diagnosis not present

## 2019-09-18 DIAGNOSIS — M79671 Pain in right foot: Secondary | ICD-10-CM | POA: Diagnosis not present

## 2019-09-18 DIAGNOSIS — M25562 Pain in left knee: Secondary | ICD-10-CM | POA: Diagnosis not present

## 2019-09-21 DIAGNOSIS — M25562 Pain in left knee: Secondary | ICD-10-CM | POA: Diagnosis not present

## 2019-09-21 DIAGNOSIS — M25561 Pain in right knee: Secondary | ICD-10-CM | POA: Diagnosis not present

## 2019-09-21 DIAGNOSIS — M79671 Pain in right foot: Secondary | ICD-10-CM | POA: Diagnosis not present

## 2019-09-21 DIAGNOSIS — M25611 Stiffness of right shoulder, not elsewhere classified: Secondary | ICD-10-CM | POA: Diagnosis not present

## 2019-09-22 DIAGNOSIS — J301 Allergic rhinitis due to pollen: Secondary | ICD-10-CM | POA: Diagnosis not present

## 2019-09-22 DIAGNOSIS — J3089 Other allergic rhinitis: Secondary | ICD-10-CM | POA: Diagnosis not present

## 2019-09-22 DIAGNOSIS — F3342 Major depressive disorder, recurrent, in full remission: Secondary | ICD-10-CM | POA: Diagnosis not present

## 2019-09-25 DIAGNOSIS — M25562 Pain in left knee: Secondary | ICD-10-CM | POA: Diagnosis not present

## 2019-09-25 DIAGNOSIS — M25561 Pain in right knee: Secondary | ICD-10-CM | POA: Diagnosis not present

## 2019-09-25 DIAGNOSIS — M79671 Pain in right foot: Secondary | ICD-10-CM | POA: Diagnosis not present

## 2019-09-25 DIAGNOSIS — M25611 Stiffness of right shoulder, not elsewhere classified: Secondary | ICD-10-CM | POA: Diagnosis not present

## 2019-09-28 DIAGNOSIS — M79671 Pain in right foot: Secondary | ICD-10-CM | POA: Diagnosis not present

## 2019-09-28 DIAGNOSIS — M25561 Pain in right knee: Secondary | ICD-10-CM | POA: Diagnosis not present

## 2019-09-28 DIAGNOSIS — M25611 Stiffness of right shoulder, not elsewhere classified: Secondary | ICD-10-CM | POA: Diagnosis not present

## 2019-09-28 DIAGNOSIS — M25562 Pain in left knee: Secondary | ICD-10-CM | POA: Diagnosis not present

## 2019-09-29 DIAGNOSIS — J3089 Other allergic rhinitis: Secondary | ICD-10-CM | POA: Diagnosis not present

## 2019-09-29 DIAGNOSIS — J301 Allergic rhinitis due to pollen: Secondary | ICD-10-CM | POA: Diagnosis not present

## 2019-10-02 DIAGNOSIS — M25561 Pain in right knee: Secondary | ICD-10-CM | POA: Diagnosis not present

## 2019-10-02 DIAGNOSIS — M79671 Pain in right foot: Secondary | ICD-10-CM | POA: Diagnosis not present

## 2019-10-02 DIAGNOSIS — M25562 Pain in left knee: Secondary | ICD-10-CM | POA: Diagnosis not present

## 2019-10-02 DIAGNOSIS — M25611 Stiffness of right shoulder, not elsewhere classified: Secondary | ICD-10-CM | POA: Diagnosis not present

## 2019-10-04 DIAGNOSIS — J301 Allergic rhinitis due to pollen: Secondary | ICD-10-CM | POA: Diagnosis not present

## 2019-10-04 DIAGNOSIS — M25611 Stiffness of right shoulder, not elsewhere classified: Secondary | ICD-10-CM | POA: Diagnosis not present

## 2019-10-04 DIAGNOSIS — M25562 Pain in left knee: Secondary | ICD-10-CM | POA: Diagnosis not present

## 2019-10-04 DIAGNOSIS — M25561 Pain in right knee: Secondary | ICD-10-CM | POA: Diagnosis not present

## 2019-10-04 DIAGNOSIS — J3089 Other allergic rhinitis: Secondary | ICD-10-CM | POA: Diagnosis not present

## 2019-10-04 DIAGNOSIS — M79671 Pain in right foot: Secondary | ICD-10-CM | POA: Diagnosis not present

## 2019-10-09 DIAGNOSIS — M25561 Pain in right knee: Secondary | ICD-10-CM | POA: Diagnosis not present

## 2019-10-09 DIAGNOSIS — M25562 Pain in left knee: Secondary | ICD-10-CM | POA: Diagnosis not present

## 2019-10-09 DIAGNOSIS — M25611 Stiffness of right shoulder, not elsewhere classified: Secondary | ICD-10-CM | POA: Diagnosis not present

## 2019-10-09 DIAGNOSIS — M79671 Pain in right foot: Secondary | ICD-10-CM | POA: Diagnosis not present

## 2019-10-11 DIAGNOSIS — M79671 Pain in right foot: Secondary | ICD-10-CM | POA: Diagnosis not present

## 2019-10-11 DIAGNOSIS — M25611 Stiffness of right shoulder, not elsewhere classified: Secondary | ICD-10-CM | POA: Diagnosis not present

## 2019-10-11 DIAGNOSIS — M25562 Pain in left knee: Secondary | ICD-10-CM | POA: Diagnosis not present

## 2019-10-11 DIAGNOSIS — M25561 Pain in right knee: Secondary | ICD-10-CM | POA: Diagnosis not present

## 2019-10-12 DIAGNOSIS — J3089 Other allergic rhinitis: Secondary | ICD-10-CM | POA: Diagnosis not present

## 2019-10-12 DIAGNOSIS — Z0000 Encounter for general adult medical examination without abnormal findings: Secondary | ICD-10-CM | POA: Diagnosis not present

## 2019-10-12 DIAGNOSIS — J301 Allergic rhinitis due to pollen: Secondary | ICD-10-CM | POA: Diagnosis not present

## 2019-10-12 DIAGNOSIS — Z0001 Encounter for general adult medical examination with abnormal findings: Secondary | ICD-10-CM | POA: Diagnosis not present

## 2019-10-12 DIAGNOSIS — E559 Vitamin D deficiency, unspecified: Secondary | ICD-10-CM | POA: Diagnosis not present

## 2019-10-12 DIAGNOSIS — D595 Paroxysmal nocturnal hemoglobinuria [Marchiafava-Micheli]: Secondary | ICD-10-CM | POA: Diagnosis not present

## 2019-10-12 DIAGNOSIS — M329 Systemic lupus erythematosus, unspecified: Secondary | ICD-10-CM | POA: Diagnosis not present

## 2019-10-16 DIAGNOSIS — M25561 Pain in right knee: Secondary | ICD-10-CM | POA: Diagnosis not present

## 2019-10-16 DIAGNOSIS — J3089 Other allergic rhinitis: Secondary | ICD-10-CM | POA: Diagnosis not present

## 2019-10-16 DIAGNOSIS — J301 Allergic rhinitis due to pollen: Secondary | ICD-10-CM | POA: Diagnosis not present

## 2019-10-16 DIAGNOSIS — M25611 Stiffness of right shoulder, not elsewhere classified: Secondary | ICD-10-CM | POA: Diagnosis not present

## 2019-10-16 DIAGNOSIS — M25562 Pain in left knee: Secondary | ICD-10-CM | POA: Diagnosis not present

## 2019-10-16 DIAGNOSIS — M79671 Pain in right foot: Secondary | ICD-10-CM | POA: Diagnosis not present

## 2019-10-19 DIAGNOSIS — M25611 Stiffness of right shoulder, not elsewhere classified: Secondary | ICD-10-CM | POA: Diagnosis not present

## 2019-10-19 DIAGNOSIS — M25562 Pain in left knee: Secondary | ICD-10-CM | POA: Diagnosis not present

## 2019-10-19 DIAGNOSIS — M25561 Pain in right knee: Secondary | ICD-10-CM | POA: Diagnosis not present

## 2019-10-19 DIAGNOSIS — M79671 Pain in right foot: Secondary | ICD-10-CM | POA: Diagnosis not present

## 2019-10-25 DIAGNOSIS — M25561 Pain in right knee: Secondary | ICD-10-CM | POA: Diagnosis not present

## 2019-10-25 DIAGNOSIS — M25562 Pain in left knee: Secondary | ICD-10-CM | POA: Diagnosis not present

## 2019-10-25 DIAGNOSIS — M79671 Pain in right foot: Secondary | ICD-10-CM | POA: Diagnosis not present

## 2019-10-25 DIAGNOSIS — M25611 Stiffness of right shoulder, not elsewhere classified: Secondary | ICD-10-CM | POA: Diagnosis not present

## 2019-10-27 DIAGNOSIS — M25611 Stiffness of right shoulder, not elsewhere classified: Secondary | ICD-10-CM | POA: Diagnosis not present

## 2019-10-27 DIAGNOSIS — M79671 Pain in right foot: Secondary | ICD-10-CM | POA: Diagnosis not present

## 2019-10-27 DIAGNOSIS — M25562 Pain in left knee: Secondary | ICD-10-CM | POA: Diagnosis not present

## 2019-10-27 DIAGNOSIS — M25561 Pain in right knee: Secondary | ICD-10-CM | POA: Diagnosis not present

## 2019-10-30 DIAGNOSIS — M25562 Pain in left knee: Secondary | ICD-10-CM | POA: Diagnosis not present

## 2019-10-30 DIAGNOSIS — M79671 Pain in right foot: Secondary | ICD-10-CM | POA: Diagnosis not present

## 2019-10-30 DIAGNOSIS — M25561 Pain in right knee: Secondary | ICD-10-CM | POA: Diagnosis not present

## 2019-10-30 DIAGNOSIS — M25611 Stiffness of right shoulder, not elsewhere classified: Secondary | ICD-10-CM | POA: Diagnosis not present

## 2019-10-31 DIAGNOSIS — J3089 Other allergic rhinitis: Secondary | ICD-10-CM | POA: Diagnosis not present

## 2019-10-31 DIAGNOSIS — J301 Allergic rhinitis due to pollen: Secondary | ICD-10-CM | POA: Diagnosis not present

## 2020-05-17 ENCOUNTER — Encounter: Payer: Self-pay | Admitting: Obstetrics and Gynecology

## 2020-05-17 ENCOUNTER — Other Ambulatory Visit: Payer: Self-pay

## 2020-05-17 ENCOUNTER — Ambulatory Visit (INDEPENDENT_AMBULATORY_CARE_PROVIDER_SITE_OTHER): Payer: BC Managed Care – PPO | Admitting: Obstetrics and Gynecology

## 2020-05-17 VITALS — BP 102/64 | HR 77 | Ht 66.0 in | Wt 159.8 lb

## 2020-05-17 DIAGNOSIS — N952 Postmenopausal atrophic vaginitis: Secondary | ICD-10-CM

## 2020-05-17 DIAGNOSIS — G8929 Other chronic pain: Secondary | ICD-10-CM

## 2020-05-17 DIAGNOSIS — M549 Dorsalgia, unspecified: Secondary | ICD-10-CM

## 2020-05-17 DIAGNOSIS — Z01419 Encounter for gynecological examination (general) (routine) without abnormal findings: Secondary | ICD-10-CM

## 2020-05-17 DIAGNOSIS — M25511 Pain in right shoulder: Secondary | ICD-10-CM

## 2020-05-17 DIAGNOSIS — N941 Unspecified dyspareunia: Secondary | ICD-10-CM | POA: Diagnosis not present

## 2020-05-17 DIAGNOSIS — M25512 Pain in left shoulder: Secondary | ICD-10-CM

## 2020-05-17 DIAGNOSIS — N62 Hypertrophy of breast: Secondary | ICD-10-CM

## 2020-05-17 DIAGNOSIS — Z0000 Encounter for general adult medical examination without abnormal findings: Secondary | ICD-10-CM

## 2020-05-17 MED ORDER — ESTRADIOL 10 MCG VA TABS: 1.0000 | ORAL_TABLET | VAGINAL | 3 refills | Status: DC

## 2020-05-17 NOTE — Progress Notes (Signed)
Referral to Dr.David [facility] placed for consult for breast reconstruction per Dr.Jertson.
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
## 2020-05-17 NOTE — Patient Instructions (Addendum)
EXERCISE AND DIET:  We recommended that you start or continue a regular exercise program for good health. Regular exercise means any activity that makes your heart beat faster and makes you sweat.  We recommend exercising at least 30 minutes per day at least 3 days a week, preferably 4 or 5.  We also recommend a diet low in fat and sugar.  Inactivity, poor dietary choices and obesity can cause diabetes, heart attack, stroke, and kidney damage, among others.    ALCOHOL AND SMOKING:  Women should limit their alcohol intake to no more than 7 drinks/beers/glasses of wine (combined, not each!) per week. Moderation of alcohol intake to this level decreases your risk of breast cancer and liver damage. And of course, no recreational drugs are part of a healthy lifestyle.  And absolutely no smoking or even second hand smoke. Most people know smoking can cause heart and lung diseases, but did you know it also contributes to weakening of your bones? Aging of your skin?  Yellowing of your teeth and nails?  CALCIUM AND VITAMIN D:  Adequate intake of calcium and Vitamin D are recommended.  The recommendations for exact amounts of these supplements seem to change often, but generally speaking 1,200 mg of calcium (between diet and supplement) and 800 units of Vitamin D per day seems prudent. Certain women may benefit from higher intake of Vitamin D.  If you are among these women, your doctor will have told you during your visit.    PAP SMEARS:  Pap smears, to check for cervical cancer or precancers,  have traditionally been done yearly, although recent scientific advances have shown that most women can have pap smears less often.  However, every woman still should have a physical exam from her gynecologist every year. It will include a breast check, inspection of the vulva and vagina to check for abnormal growths or skin changes, a visual exam of the cervix, and then an exam to evaluate the size and shape of the uterus and  ovaries.  And after 59 years of age, a rectal exam is indicated to check for rectal cancers. We will also provide age appropriate advice regarding health maintenance, like when you should have certain vaccines, screening for sexually transmitted diseases, bone density testing, colonoscopy, mammograms, etc.   MAMMOGRAMS:  All women over 40 years old should have a yearly mammogram. Many facilities now offer a "3D" mammogram, which may cost around $50 extra out of pocket. If possible,  we recommend you accept the option to have the 3D mammogram performed.  It both reduces the number of women who will be called back for extra views which then turn out to be normal, and it is better than the routine mammogram at detecting truly abnormal areas.    COLON CANCER SCREENING: Now recommend starting at age 45. At this time colonoscopy is not covered for routine screening until 50. There are take home tests that can be done between 45-49.   COLONOSCOPY:  Colonoscopy to screen for colon cancer is recommended for all women at age 50.  We know, you hate the idea of the prep.  We agree, BUT, having colon cancer and not knowing it is worse!!  Colon cancer so often starts as a polyp that can be seen and removed at colonscopy, which can quite literally save your life!  And if your first colonoscopy is normal and you have no family history of colon cancer, most women don't have to have it again for   10 years.  Once every ten years, you can do something that may end up saving your life, right?  We will be happy to help you get it scheduled when you are ready.  Be sure to check your insurance coverage so you understand how much it will cost.  It may be covered as a preventative service at no cost, but you should check your particular policy.      Breast Self-Awareness Breast self-awareness means being familiar with how your breasts look and feel. It involves checking your breasts regularly and reporting any changes to your  health care provider. Practicing breast self-awareness is important. A change in your breasts can be a sign of a serious medical problem. Being familiar with how your breasts look and feel allows you to find any problems early, when treatment is more likely to be successful. All women should practice breast self-awareness, including women who have had breast implants. How to do a breast self-exam One way to learn what is normal for your breasts and whether your breasts are changing is to do a breast self-exam. To do a breast self-exam: Look for Changes  1. Remove all the clothing above your waist. 2. Stand in front of a mirror in a room with good lighting. 3. Put your hands on your hips. 4. Push your hands firmly downward. 5. Compare your breasts in the mirror. Look for differences between them (asymmetry), such as: ? Differences in shape. ? Differences in size. ? Puckers, dips, and bumps in one breast and not the other. 6. Look at each breast for changes in your skin, such as: ? Redness. ? Scaly areas. 7. Look for changes in your nipples, such as: ? Discharge. ? Bleeding. ? Dimpling. ? Redness. ? A change in position. Feel for Changes Carefully feel your breasts for lumps and changes. It is best to do this while lying on your back on the floor and again while sitting or standing in the shower or tub with soapy water on your skin. Feel each breast in the following way:  Place the arm on the side of the breast you are examining above your head.  Feel your breast with the other hand.  Start in the nipple area and make  inch (2 cm) overlapping circles to feel your breast. Use the pads of your three middle fingers to do this. Apply light pressure, then medium pressure, then firm pressure. The light pressure will allow you to feel the tissue closest to the skin. The medium pressure will allow you to feel the tissue that is a little deeper. The firm pressure will allow you to feel the tissue  close to the ribs.  Continue the overlapping circles, moving downward over the breast until you feel your ribs below your breast.  Move one finger-width toward the center of the body. Continue to use the  inch (2 cm) overlapping circles to feel your breast as you move slowly up toward your collarbone.  Continue the up and down exam using all three pressures until you reach your armpit.  Write Down What You Find  Write down what is normal for each breast and any changes that you find. Keep a written record with breast changes or normal findings for each breast. By writing this information down, you do not need to depend only on memory for size, tenderness, or location. Write down where you are in your menstrual cycle, if you are still menstruating. If you are having trouble noticing differences   in your breasts, do not get discouraged. With time you will become more familiar with the variations in your breasts and more comfortable with the exam. How often should I examine my breasts? Examine your breasts every month. If you are breastfeeding, the best time to examine your breasts is after a feeding or after using a breast pump. If you menstruate, the best time to examine your breasts is 5-7 days after your period is over. During your period, your breasts are lumpier, and it may be more difficult to notice changes. When should I see my health care provider? See your health care provider if you notice:  A change in shape or size of your breasts or nipples.  A change in the skin of your breast or nipples, such as a reddened or scaly area.  Unusual discharge from your nipples.  A lump or thick area that was not there before.  Pain in your breasts.  Anything that concerns you.  Atrophic Vaginitis  Atrophic vaginitis is a condition in which the tissues that line the vagina become dry and thin. This condition is most common in women who have stopped having regular menstrual periods (are in  menopause). This usually starts when a woman is 45-55 years old. That is the time when a woman's estrogen levels begin to drop (decrease). Estrogen is a female hormone. It helps to keep the tissues of the vagina moist. It stimulates the vagina to produce a clear fluid that lubricates the vagina for sexual intercourse. This fluid also protects the vagina from infection. Lack of estrogen can cause the lining of the vagina to get thinner and dryer. The vagina may also shrink in size. It may become less elastic. Atrophic vaginitis tends to get worse over time as a woman's estrogen level drops. What are the causes? This condition is caused by the normal drop in estrogen that happens around the time of menopause. What increases the risk? Certain conditions or situations may lower a woman's estrogen level, leading to a higher risk for atrophic vaginitis. You are more likely to develop this condition if:  You are taking medicines that block estrogen.  You have had your ovaries removed.  You are being treated for cancer with X-ray (radiation) or medicines (chemotherapy).  You have given birth or are breastfeeding.  You are older than age 50.  You smoke. What are the signs or symptoms? Symptoms of this condition include:  Pain, soreness, or bleeding during sexual intercourse (dyspareunia).  Vaginal burning, irritation, or itching.  Pain or bleeding when a speculum is used in a vaginal exam (pelvic exam).  Having burning pain when passing urine.  Vaginal discharge that is brown or yellow. In some cases, there are no symptoms. How is this diagnosed? This condition is diagnosed by taking a medical history and doing a physical exam. This will include a pelvic exam that checks the vaginal tissues. Though rare, you may also have other tests, including:  A urine test.  A test that checks the acid balance in your vagina (acid balance test). How is this treated? Treatment for this condition  depends on how severe your symptoms are. Treatment may include:  Using an over-the-counter vaginal lubricant before sex.  Using a long-acting vaginal moisturizer.  Using low-dose vaginal estrogen for moderate to severe symptoms that do not respond to other treatments. Options include creams, tablets, and inserts (vaginal rings). Before you use a vaginal estrogen, tell your health care provider if you have a history of: ?   Breast cancer. ? Endometrial cancer. ? Blood clots. If you are not sexually active and your symptoms are very mild, you may not need treatment. Follow these instructions at home: Medicines  Take over-the-counter and prescription medicines only as told by your health care provider. Do not use herbal or alternative medicines unless your health care provider says that you can.  Use over-the-counter creams, lubricants, or moisturizers for dryness only as directed by your health care provider. General instructions  If your atrophic vaginitis is caused by menopause, discuss all of your menopause symptoms and treatment options with your health care provider.  Do not douche.  Do not use products that can make your vagina dry. These include: ? Scented feminine sprays. ? Scented tampons. ? Scented soaps.  Vaginal intercourse can help to improve blood flow and elasticity of vaginal tissue. If it hurts to have sex, try using a lubricant or moisturizer just before having intercourse. Contact a health care provider if:  Your discharge looks different than normal.  Your vagina has an unusual smell.  You have new symptoms.  Your symptoms do not improve with treatment.  Your symptoms get worse. Summary  Atrophic vaginitis is a condition in which the tissues that line the vagina become dry and thin. It is most common in women who have stopped having regular menstrual periods (are in menopause).  Treatment options include using vaginal lubricants and low-dose vaginal  estrogen.  Contact a health care provider if your vagina has an unusual smell, or if your symptoms get worse or do not improve after treatment. This information is not intended to replace advice given to you by your health care provider. Make sure you discuss any questions you have with your health care provider. Document Revised: 10/08/2017 Document Reviewed: 07/22/2017 Elsevier Patient Education  2020 [employer]  

## 2020-05-18 LAB — LIPID PANEL
Chol/HDL Ratio: 3 ratio (ref 0.0–4.4)
Cholesterol, Total: 197 mg/dL (ref 100–199)
HDL: 66 mg/dL (ref >39)
LDL Chol Calc (NIH): 112 mg/dL — ABNORMAL HIGH (ref 0–99)
Triglycerides: 110 mg/dL (ref 0–149)
VLDL Cholesterol Cal: 19 mg/dL (ref 5–40)

## 2020-05-29 ENCOUNTER — Encounter: Payer: Self-pay | Admitting: Obstetrics and Gynecology

## 2020-06-06 ENCOUNTER — Encounter: Payer: Self-pay | Admitting: Obstetrics and Gynecology

## 2020-07-30 ENCOUNTER — Telehealth: Payer: Self-pay | Admitting: *Deleted

## 2020-07-30 ENCOUNTER — Encounter: Payer: Self-pay | Admitting: Obstetrics and Gynecology

## 2020-07-30 NOTE — Telephone Encounter (Signed)
See telephone encounter dated 07/30/20.   Encounter closed.

## 2020-07-30 NOTE — Telephone Encounter (Signed)
Elayne Guerin [facility] I am taking cephalexin for an oral infection for 10 days so I am going to STOP using the estriodol until I am finished with the antibiotics.  Just FYI, per drug interaction warnings.  Chaya Jan    Per review of med list, patient is on estradiol 10 mcg vag tab twice wkly.  Reviewed drug interactions in UpToDate between cephalexin and estradiol, no interaction or risk identified.   Routing to Dr. Oscar La to review

## 2020-07-31 NOTE — Telephone Encounter (Signed)
I agree, it is fine for her to use her vaginal estrogen while on cephalexin.

## 2020-08-01 NOTE — Telephone Encounter (Signed)
Left detailed message on home number, ok per dpr.  Also sent MyChart message with recommendations.   Encounter closed.

## 2021-01-22 DIAGNOSIS — M25511 Pain in right shoulder: Secondary | ICD-10-CM

## 2021-01-22 HISTORY — DX: Pain in right shoulder: M25.511

## 2021-02-26 ENCOUNTER — Other Ambulatory Visit: Payer: Self-pay | Admitting: Orthopedic Surgery

## 2021-02-26 DIAGNOSIS — M21619 Bunion of unspecified foot: Secondary | ICD-10-CM

## 2021-02-26 DIAGNOSIS — M2041 Other hammer toe(s) (acquired), right foot: Secondary | ICD-10-CM | POA: Insufficient documentation

## 2021-02-26 DIAGNOSIS — M25511 Pain in right shoulder: Secondary | ICD-10-CM

## 2021-02-26 DIAGNOSIS — M7741 Metatarsalgia, right foot: Secondary | ICD-10-CM | POA: Insufficient documentation

## 2021-02-26 HISTORY — DX: Other hammer toe(s) (acquired), right foot: M20.41

## 2021-02-26 HISTORY — DX: Metatarsalgia, right foot: M77.41

## 2021-02-26 HISTORY — DX: Bunion of unspecified foot: M21.619

## 2021-03-12 ENCOUNTER — Other Ambulatory Visit: Payer: Self-pay | Admitting: Otolaryngology

## 2021-03-12 DIAGNOSIS — J329 Chronic sinusitis, unspecified: Secondary | ICD-10-CM

## 2021-03-14 ENCOUNTER — Ambulatory Visit
Admission: RE | Admit: 2021-03-14 | Discharge: 2021-03-14 | Disposition: A | Discharge status: Home / Self Care | Payer: BC Managed Care – PPO | Source: Ambulatory Visit | Attending: Otolaryngology | Admitting: Otolaryngology

## 2021-03-14 ENCOUNTER — Other Ambulatory Visit: Payer: Self-pay

## 2021-03-14 ENCOUNTER — Ambulatory Visit
Admission: RE | Admit: 2021-03-14 | Discharge: 2021-03-14 | Disposition: A | Discharge status: Home / Self Care | Payer: BC Managed Care – PPO | Source: Ambulatory Visit | Attending: Orthopedic Surgery | Admitting: Orthopedic Surgery

## 2021-03-14 DIAGNOSIS — M25511 Pain in right shoulder: Secondary | ICD-10-CM

## 2021-03-14 DIAGNOSIS — J329 Chronic sinusitis, unspecified: Secondary | ICD-10-CM

## 2021-03-15 ENCOUNTER — Inpatient Hospital Stay: Admission: RE | Admit: 2021-03-15 | Payer: BC Managed Care – PPO | Source: Ambulatory Visit

## 2021-05-29 ENCOUNTER — Ambulatory Visit: Payer: BC Managed Care – PPO | Admitting: Obstetrics and Gynecology

## 2021-05-30 ENCOUNTER — Encounter: Payer: Self-pay | Admitting: Obstetrics and Gynecology

## 2021-05-30 NOTE — Telephone Encounter (Signed)
Annual exam scheduled on 07/23/21 Mammogram 05/20/2020  Okay to send Rx?

## 2021-05-31 ENCOUNTER — Other Ambulatory Visit: Payer: Self-pay | Admitting: Obstetrics and Gynecology

## 2021-05-31 MED ORDER — ESTRADIOL 10 MCG VA TABS: 1.0000 | ORAL_TABLET | VAGINAL | 0 refills | Status: DC

## 2021-05-31 NOTE — Progress Notes (Signed)
Refill sent.

## 2021-06-27 ENCOUNTER — Ambulatory Visit: Payer: BC Managed Care – PPO | Admitting: Obstetrics and Gynecology
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
## 2021-07-23 ENCOUNTER — Other Ambulatory Visit (HOSPITAL_COMMUNITY)
Admission: RE | Admit: 2021-07-23 | Discharge: 2021-07-23 | Disposition: A | Discharge status: Home / Self Care | Payer: BC Managed Care – PPO | Source: Ambulatory Visit | Attending: Obstetrics and Gynecology | Admitting: Obstetrics and Gynecology

## 2021-07-23 ENCOUNTER — Ambulatory Visit (INDEPENDENT_AMBULATORY_CARE_PROVIDER_SITE_OTHER): Payer: BC Managed Care – PPO | Admitting: Obstetrics and Gynecology

## 2021-07-23 ENCOUNTER — Other Ambulatory Visit: Payer: Self-pay

## 2021-07-23 ENCOUNTER — Encounter: Payer: Self-pay | Admitting: Obstetrics and Gynecology

## 2021-07-23 VITALS — BP 110/60 | HR 77 | Ht 67.0 in | Wt 154.0 lb

## 2021-07-23 DIAGNOSIS — L501 Idiopathic urticaria: Secondary | ICD-10-CM

## 2021-07-23 DIAGNOSIS — J301 Allergic rhinitis due to pollen: Secondary | ICD-10-CM

## 2021-07-23 DIAGNOSIS — J309 Allergic rhinitis, unspecified: Secondary | ICD-10-CM | POA: Insufficient documentation

## 2021-07-23 DIAGNOSIS — N952 Postmenopausal atrophic vaginitis: Secondary | ICD-10-CM | POA: Diagnosis not present

## 2021-07-23 DIAGNOSIS — J3081 Allergic rhinitis due to animal (cat) (dog) hair and dander: Secondary | ICD-10-CM | POA: Insufficient documentation

## 2021-07-23 DIAGNOSIS — Z01419 Encounter for gynecological examination (general) (routine) without abnormal findings: Secondary | ICD-10-CM | POA: Diagnosis not present

## 2021-07-23 DIAGNOSIS — Z124 Encounter for screening for malignant neoplasm of cervix: Secondary | ICD-10-CM | POA: Diagnosis not present

## 2021-07-23 DIAGNOSIS — H1045 Other chronic allergic conjunctivitis: Secondary | ICD-10-CM

## 2021-07-23 HISTORY — DX: Other chronic allergic conjunctivitis: H10.45

## 2021-07-23 HISTORY — DX: Allergic rhinitis due to pollen: J30.1

## 2021-07-23 HISTORY — DX: Allergic rhinitis due to animal (cat) (dog) hair and dander: J30.81

## 2021-07-23 HISTORY — DX: Idiopathic urticaria: L50.1

## 2021-07-23 MED ORDER — ESTRADIOL 10 MCG VA TABS: 1.0000 | ORAL_TABLET | VAGINAL | 3 refills | Status: DC

## 2021-07-23 NOTE — Patient Instructions (Signed)

## 2021-07-28 LAB — CYTOLOGY - PAP
Comment: NEGATIVE
Diagnosis: NEGATIVE
High risk HPV: NEGATIVE

## 2021-08-28 ENCOUNTER — Other Ambulatory Visit: Payer: Self-pay | Admitting: Obstetrics and Gynecology

## 2021-08-28 DIAGNOSIS — N952 Postmenopausal atrophic vaginitis: Secondary | ICD-10-CM

## 2021-11-09 HISTORY — PX: BUNIONECTOMY: SHX129

## 2022-03-05 ENCOUNTER — Encounter: Payer: Self-pay | Admitting: Physician Assistant

## 2022-03-10 ENCOUNTER — Ambulatory Visit (INDEPENDENT_AMBULATORY_CARE_PROVIDER_SITE_OTHER): Payer: BC Managed Care – PPO | Admitting: Physician Assistant

## 2022-03-10 ENCOUNTER — Encounter: Payer: Self-pay | Admitting: Physician Assistant

## 2022-03-10 VITALS — BP 133/82 | HR 102 | Resp 18 | Ht 67.0 in | Wt 162.0 lb

## 2022-03-10 DIAGNOSIS — R413 Other amnesia: Secondary | ICD-10-CM

## 2022-03-10 DIAGNOSIS — G3184 Mild cognitive impairment, so stated: Secondary | ICD-10-CM
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
## 2022-03-10 NOTE — Patient Instructions (Addendum)
It was a pleasure to see you today at our office.  ? ?Recommendations: ? ?Follow up 1 month  ?MRI brain  ?Neurocognitive testing  ? ? ?RECOMMENDATIONS FOR ALL PATIENTS WITH MEMORY PROBLEMS: ?1. Continue to exercise (Recommend 30 minutes of walking everyday, or 3 hours every week) ?2. Increase social interactions - continue going to [place] and enjoy social gatherings with friends and family ?3. Eat healthy, avoid fried foods and eat more fruits and vegetables ?4. Maintain adequate blood pressure, blood sugar, and blood cholesterol level. Reducing the risk of stroke and cardiovascular disease also helps promoting better memory. ?5. Avoid stressful situations. Live a simple life and avoid aggravations. Organize your time and prepare for the next day in anticipation. ?6. Sleep well, avoid any interruptions of sleep and avoid any distractions in the bedroom that may interfere with adequate sleep quality ?7. Avoid sugar, avoid sweets as there is a strong link between excessive sugar intake, diabetes, and cognitive impairment ?We discussed the Mediterranean diet, which has been shown to help patients reduce the risk of progressive memory disorders and reduces cardiovascular risk. This includes eating fish, eat fruits and green leafy vegetables, nuts like almonds and hazelnuts, walnuts, and also use olive oil. Avoid fast foods and fried foods as much as possible. Avoid sweets and sugar as sugar use has been linked to worsening of memory function. ? ?There is always a concern of gradual progression of memory problems. If this is the case, then we may need to adjust level of care according to patient needs. Support, both to the patient and caregiver, should then be put into place.  ? ? ?FALL PRECAUTIONS: Be cautious when walking. Scan the area for obstacles that may increase the risk of trips and falls. When getting up in the mornings, sit up at the edge of the bed for a few minutes before getting out of bed. Consider  elevating the bed at the head end to avoid drop of blood pressure when getting up. Walk always in a well-lit room (use night lights in the walls). Avoid area rugs or power cords from appliances in the middle of the walkways. Use a walker or a cane if necessary and consider physical therapy for balance exercise. Get your eyesight checked regularly. ? ?FINANCIAL OVERSIGHT: Supervision, especially oversight when making financial decisions or transactions is also recommended. ? ?HOME SAFETY: Consider the safety of the kitchen when operating appliances like stoves, microwave oven, and blender. Consider having supervision and share cooking responsibilities until no longer able to participate in those. Accidents with firearms and other hazards in the house should be identified and addressed as well. ? ? ?ABILITY TO BE LEFT ALONE: If patient is unable to contact 911 operator, consider using LifeLine, or when the need is there, arrange for someone to stay with patients. Smoking is a fire hazard, consider supervision or cessation. Risk of wandering should be assessed by caregiver and if detected at any point, supervision and safe proof recommendations should be instituted. ? ?MEDICATION SUPERVISION: Inability to self-administer medication needs to be constantly addressed. Implement a mechanism to ensure safe administration of the medications. ? ? ?DRIVING: Regarding driving, in patients with progressive memory problems, driving will be impaired. We advise to have someone else do the driving if trouble finding directions or if minor accidents are reported. Independent driving assessment is available to determine safety of driving. ? ? ?If you are interested in the driving assessment, you can contact the following: ? ?[facility]  in [place] [phone] ? ?[facility] [phone] ? ?[facility] [phone] ? ?[facility] [phone] or [phone] ? We have sent a referral  to [facility] for your MRI and they will call you directly to schedule your appointment. They are located at [address] [facility]. If you need to contact them directly please call [phone].  ?

## 2022-03-27 ENCOUNTER — Ambulatory Visit
Admission: RE | Admit: 2022-03-27 | Discharge: 2022-03-27 | Disposition: A | Discharge status: Home / Self Care | Payer: BC Managed Care – PPO | Source: Ambulatory Visit | Attending: Physician Assistant | Admitting: Physician Assistant

## 2022-03-27 MED ORDER — GADOBENATE DIMEGLUMINE 529 MG/ML IV SOLN
15.0000 mL | Freq: Once | INTRAVENOUS | Status: AC | PRN
  Administered 2022-03-27: 15 mL via INTRAVENOUS

## 2022-03-31 ENCOUNTER — Ambulatory Visit: Payer: BC Managed Care – PPO | Admitting: Psychiatry

## 2022-04-03 NOTE — Progress Notes (Signed)
Your  brain MRI does not show anything acute,it is essentially normal, the only finding is  a chronic, very tiny old remote stroke in the cerebellum that is so tiny that is not responsible for any unbalance. Make sure you have good control of the cardiovascular risk factors like cholesterol, sugars, blood pressure.  Recommend baby aspirin a day for prevention. Thanks

## 2022-04-16 ENCOUNTER — Ambulatory Visit (INDEPENDENT_AMBULATORY_CARE_PROVIDER_SITE_OTHER): Payer: BC Managed Care – PPO | Admitting: Physician Assistant

## 2022-04-16 ENCOUNTER — Encounter: Payer: Self-pay | Admitting: Physician Assistant

## 2022-04-16 VITALS — BP 111/69 | HR 97 | Resp 20 | Ht 67.0 in | Wt 157.0 lb

## 2022-04-16 DIAGNOSIS — G3184 Mild cognitive impairment, so stated: Secondary | ICD-10-CM

## 2022-04-16 NOTE — Patient Instructions (Addendum)
It was a pleasure to see you today at our office.   Recommendations:  [place], no indication of dementia is seen in the brain!   Neurocognitive testing  Follow up pending on the results of the neurocognitive testing    RECOMMENDATIONS FOR ALL PATIENTS WITH MEMORY PROBLEMS: 1. Continue to exercise (Recommend 30 minutes of walking everyday, or 3 hours every week) 2. Increase social interactions - continue going to [place] and enjoy social gatherings with friends and family 3. Eat healthy, avoid fried foods and eat more fruits and vegetables 4. Maintain adequate blood pressure, blood sugar, and blood cholesterol level. Reducing the risk of stroke and cardiovascular disease also helps promoting better memory. 5. Avoid stressful situations. Live a simple life and avoid aggravations. Organize your time and prepare for the next day in anticipation. 6. Sleep well, avoid any interruptions of sleep and avoid any distractions in the bedroom that may interfere with adequate sleep quality 7. Avoid sugar, avoid sweets as there is a strong link between excessive sugar intake, diabetes, and cognitive impairment We discussed the Mediterranean diet, which has been shown to help patients reduce the risk of progressive memory disorders and reduces cardiovascular risk. This includes eating fish, eat fruits and green leafy vegetables, nuts like almonds and hazelnuts, walnuts, and also use olive oil. Avoid fast foods and fried foods as much as possible. Avoid sweets and sugar as sugar use has been linked to worsening of memory function.  There is always a concern of gradual progression of memory problems. If this is the case, then we may need to adjust level of care according to patient needs. Support, both to the patient and caregiver, should then be put into place.    FALL PRECAUTIONS: Be cautious when walking. Scan the area for obstacles that may increase the risk of trips and falls. When getting up in the  mornings, sit up at the edge of the bed for a few minutes before getting out of bed. Consider elevating the bed at the head end to avoid drop of blood pressure when getting up. Walk always in a well-lit room (use night lights in the walls). Avoid area rugs or power cords from appliances in the middle of the walkways. Use a walker or a cane if necessary and consider physical therapy for balance exercise. Get your eyesight checked regularly.  FINANCIAL OVERSIGHT: Supervision, especially oversight when making financial decisions or transactions is also recommended.  HOME SAFETY: Consider the safety of the kitchen when operating appliances like stoves, microwave oven, and blender. Consider having supervision and share cooking responsibilities until no longer able to participate in those. Accidents with firearms and other hazards in the house should be identified and addressed as well.   ABILITY TO BE LEFT ALONE: If patient is unable to contact 911 operator, consider using LifeLine, or when the need is there, arrange for someone to stay with patients. Smoking is a fire hazard, consider supervision or cessation. Risk of wandering should be assessed by caregiver and if detected at any point, supervision and safe proof recommendations should be instituted.  MEDICATION SUPERVISION: Inability to self-administer medication needs to be constantly addressed. Implement a mechanism to ensure safe administration of the medications.   DRIVING: Regarding driving, in patients with progressive memory problems, driving will be impaired. We advise to have someone else do the driving if trouble finding directions or if minor accidents are reported. Independent driving assessment is available to determine safety of driving.   If you  are interested in the driving assessment, you can contact the following:  [facility] in [place] [phone]  [facility] [phone]  [facility] [phone] or [phone]  We have sent a referral to [facility] for your MRI and they will call you directly to schedule your appointment. They are located at [address] [facility]. If you need to contact them directly please call [phone].
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
## 2022-05-07 ENCOUNTER — Encounter: Payer: Self-pay | Admitting: Obstetrics and Gynecology

## 2022-05-07 ENCOUNTER — Encounter: Payer: Self-pay | Admitting: Physician Assistant

## 2022-05-07 ENCOUNTER — Encounter: Payer: Self-pay | Admitting: Psychology

## 2022-05-07 DIAGNOSIS — Z8616 Personal history of COVID-19: Secondary | ICD-10-CM | POA: Insufficient documentation

## 2022-05-07 DIAGNOSIS — F909 Attention-deficit hyperactivity disorder, unspecified type: Secondary | ICD-10-CM | POA: Insufficient documentation

## 2022-05-07 DIAGNOSIS — G4733 Obstructive sleep apnea (adult) (pediatric): Secondary | ICD-10-CM | POA: Insufficient documentation

## 2022-05-07 DIAGNOSIS — F411 Generalized anxiety disorder: Secondary | ICD-10-CM | POA: Insufficient documentation

## 2022-05-07 DIAGNOSIS — F329 Major depressive disorder, single episode, unspecified: Secondary | ICD-10-CM | POA: Insufficient documentation

## 2022-05-08 ENCOUNTER — Encounter: Payer: Self-pay | Admitting: Psychology

## 2022-05-08 ENCOUNTER — Ambulatory Visit (INDEPENDENT_AMBULATORY_CARE_PROVIDER_SITE_OTHER): Payer: BC Managed Care – PPO | Admitting: Psychology

## 2022-05-08 ENCOUNTER — Ambulatory Visit: Payer: BC Managed Care – PPO

## 2022-05-08 DIAGNOSIS — F331 Major depressive disorder, recurrent, moderate: Secondary | ICD-10-CM | POA: Diagnosis not present

## 2022-05-08 DIAGNOSIS — F411 Generalized anxiety disorder: Secondary | ICD-10-CM | POA: Diagnosis not present

## 2022-05-08 DIAGNOSIS — M797 Fibromyalgia: Secondary | ICD-10-CM

## 2022-05-08 DIAGNOSIS — I6381 Other cerebral infarction due to occlusion or stenosis of small artery: Secondary | ICD-10-CM | POA: Insufficient documentation

## 2022-05-08 DIAGNOSIS — R4189 Other symptoms and signs involving cognitive functions and awareness: Secondary | ICD-10-CM

## 2022-05-08 DIAGNOSIS — M328 Other forms of systemic lupus erythematosus: Secondary | ICD-10-CM

## 2022-05-08 NOTE — Telephone Encounter (Signed)
Appointments per JJ "I recommended this patient called to make an appointment, can you please make sure she gets in prior to her mammogram on 7/13. Thanks "

## 2022-05-08 NOTE — Progress Notes (Signed)
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 Percentage 100 (51) 54 Average    Recognition Discriminability 6 (47) 38 Average  Story Learning       Immediate Recall 61/80 (47) 38 Average    Delayed Recall 30/40 (44) 27 Average    Retention Percentage 86 (45) 31 Average  Daily Living Memory       Immediate Recall 45/51 (54) 66 Average    Delayed Recall 15/17 (53) 62 Average    Retention Percentage 88 (51) 54 Average    Recognition Hits 9/10 (51) 54 Average       Attention/Executive Function:     Trail Making Test (TMT): Raw Score (T Score) Percentile     Part A 28 secs.,  1 error (54) 66 Average    Part B 80 secs.,  0 errors (46) 34 Average         Scaled Score Percentile   WAIS-IV Coding: 11 63 Average       NAB Attention Module, Form 1: T Score Percentile     Digits Forward 43 25 Average    Digits Backwards 43 25 Average       D-KEFS Color-Word Interference Test: Raw Score (Scaled Score) Percentile     Color Naming 30 secs. (11) 63 Average    Word Reading 24 secs. (10) 50 Average    Inhibition 62 secs. (11) 63 Average      Total Errors 1 error (11) 63 Average     Inhibition/Switching 59 secs. (13) 84 Above Average      Total Errors 0 errors (13) 84 Above Average       D-KEFS Verbal Fluency Test: Raw Score (Scaled Score) Percentile     Letter Total Correct 38 (11) 63 Average    Category Total Correct 35 (10) 50 Average    Category Switching Total Correct 13 (11) 63 Average    Category Switching Accuracy 12 (11) 63 Average      Total Set Loss Errors 0 (13) 84 Above Average      Total Repetition Errors  2 (11) 63 Average       Language:     Verbal Fluency Test: Raw Score (T Score) Percentile     Phonemic Fluency (FAS) 38 (47) 38 Average    Animal Fluency 17 (45) 31 Average        NAB Language Module, Form 1: T Score Percentile     Naming 31/31 (56) 73 Average       Visuospatial/Visuoconstruction:      Raw Score Percentile   Clock Drawing: 10/10 --- Within Normal Limits       NAB Spatial Module, Form 1: T Score Percentile     Figure Drawing Copy 61 86 Above Average        Scaled Score Percentile   WAIS-IV Block Design: 14 91 Well Above Average  WAIS-IV Matrix Reasoning: 12 75 Above Average       Mood and Personality:      Raw Score Percentile   Beck Depression Inventory - II: 27 --- Moderate  PROMIS Anxiety Questionnaire: 25 --- Moderate       Personality Assessment Inventory: T Score  Percentile     Inconsistency 46 --- Within Normal Limits    Infrequency 44 --- Within Normal Limits    Negative Impression 62 --- Within Normal Limits    Positive Impression 43 --- Within Normal Limits    Somatic Complaints 75 --- Elevated    Anxiety 59 --- Within Normal Limits    Anxiety-Related Disorders 55 --- Within Normal Limits    Depression 66 --- Within Normal Limits    Mania 53 --- Within Normal Limits    Paranoia 47 --- Within Normal Limits    Schizophrenia 56 --- Within Normal Limits    Borderline Features 47 --- Within Normal Limits    Antisocial Features 44 --- Within Normal Limits    Alcohol Problems 47 --- Within Normal Limits     Drug Problems 48 --- Within Normal Limits    Aggression 45 --- Within Normal Limits    Suicidal Ideation 54 --- Within Normal Limits    Stress 57 --- Within Normal Limits    Non Support 58 --- Within Normal Limits    Treatment Rejection 35 --- Within Normal Limits    Dominance 61 --- Within Normal Limits    Warmth 60 --- Within Normal Limits       Additional Questionnaires:      Raw Score Percentile   PROMIS Sleep Disturbance Questionnaire: 13 --- None to Slight   Informed Consent and Coding/Compliance:   The current evaluation represents a clinical evaluation for the purposes previously outlined by the referral source and is in no way reflective of a forensic evaluation.   Ms. Nolton was provided with a verbal description of the nature and purpose of the present neuropsychological evaluation. Also reviewed were the foreseeable risks and/or discomforts and benefits of the procedure, limits of confidentiality, and mandatory reporting requirements of this provider. The patient was given the opportunity to ask questions and receive answers about the evaluation. Oral consent to participate was provided by the patient.   This evaluation was conducted by Newman Nickels, Ph.D., ABPP-CN, board certified clinical neuropsychologist. Ms. Creson completed a clinical interview with Dr. Milbert Coulter, billed as one unit [phone], and 180 minutes of cognitive testing and scoring, billed as one unit [phone] and five additional units [id number]. Psychometrist Shan Levans, B.S., assisted Dr. Milbert Coulter with test administration and scoring procedures. As a separate and discrete service, Dr. Milbert Coulter spent a total  of 160 minutes in interpretation and report writing billed as one unit 96132 and two units 96133.

## 2022-05-08 NOTE — Progress Notes (Signed)
   Psychometrician Note   Cognitive testing was administered to [employer] by Shan Levans, B.S. (psychometrist) under the supervision of Dr. Newman Nickels, Ph.D., licensed psychologist on 05/08/2022. Ms. Cardon did not appear overtly distressed by the testing session per behavioral observation or responses across self-report questionnaires. Rest breaks were offered.    The battery of tests administered was selected by Dr. Newman Nickels, Ph.D. with consideration to Ms. Harte's current level of functioning, the nature of her symptoms, emotional and behavioral responses during interview, level of literacy, observed level of motivation/effort, and the nature of the referral question. This battery was communicated to the psychometrist. Communication between Dr. Newman Nickels, Ph.D. and the psychometrist was ongoing throughout the evaluation and Dr. Newman Nickels, Ph.D. was immediately accessible at all times. Dr. Newman Nickels, Ph.D. provided supervision to the psychometrist on the date of this service to the extent necessary to assure the quality of all services provided.    Anastasiya Gowin will return within approximately 1-2 weeks for an interactive feedback session with Dr. Milbert Coulter at which time her test performances, clinical impressions, and treatment recommendations will be reviewed in detail. Ms. Mruk understands she can contact our office should she require our assistance before this time.  A total of 180 minutes of billable time were spent face-to-face with Ms. Westendorf by the psychometrist. This includes both test administration and scoring time. Billing for these services is reflected in the clinical report generated by Dr. Newman Nickels, Ph.D.  This note reflects time spent with the psychometrician and does not include test scores or any clinical interpretations made by Dr. Milbert Coulter. The full report will follow in a separate note.

## 2022-05-14 ENCOUNTER — Encounter: Payer: BC Managed Care – PPO | Admitting: Psychology

## 2022-05-19 ENCOUNTER — Ambulatory Visit: Payer: BC Managed Care – PPO | Admitting: Obstetrics and Gynecology

## 2022-05-21 ENCOUNTER — Encounter: Payer: BC Managed Care – PPO | Admitting: Psychology

## 2022-05-21 ENCOUNTER — Ambulatory Visit (INDEPENDENT_AMBULATORY_CARE_PROVIDER_SITE_OTHER): Payer: BC Managed Care – PPO | Admitting: Psychology

## 2022-05-21 DIAGNOSIS — R4189 Other symptoms and signs involving cognitive functions and awareness: Secondary | ICD-10-CM | POA: Diagnosis not present

## 2022-05-21 DIAGNOSIS — F331 Major depressive disorder, recurrent, moderate: Secondary | ICD-10-CM | POA: Diagnosis not present

## 2022-05-21 DIAGNOSIS — F411 Generalized anxiety disorder: Secondary | ICD-10-CM

## 2022-05-21 DIAGNOSIS — M797 Fibromyalgia: Secondary | ICD-10-CM | POA: Diagnosis not present

## 2022-05-21 DIAGNOSIS — M328 Other forms of systemic lupus erythematosus: Secondary | ICD-10-CM

## 2022-05-21 NOTE — Progress Notes (Signed)
   Neuropsychology Feedback Session [name]. [facility]  Department of Neurology  Reason for Referral:   Diane Moody is a 61 y.o. right-handed Caucasian female referred by  Marlowe Kays, PA-C , to characterize her current cognitive functioning and assist with diagnostic clarity and treatment planning in the context of subjective cognitive decline, history of lupus, and several significant psychiatric comorbidities and psychosocial stressors.   Feedback:   Diane Moody completed a comprehensive neuropsychological evaluation on 05/08/2022. Please refer to that encounter for the full report and recommendations. Briefly, results suggested neuropsychological functioning within normal limits. Performances across all assessed cognitive domains were consistently appropriate relative to age-matched peers. The etiology of her subjective dysfunction is multifactorial and likely cumulative in nature. Individuals with a history of systemic lupus erythematosus (SLE) commonly report subjective cognitive dysfunction, with research suggesting that affected domains often surround processing speed, attention/concentration, and executive functioning. In addition to this, Diane Moody described notable symptoms of anxiety and depression, significant life stressors and some marital discord, ongoing chronic pain/fibromyalgia, and untreated obstructive sleep apnea. All of these conditions, whether in isolation or when combined together, can create cognitive inefficiencies and likely account for dysfunction Diane Moody described experiencing in her day-to-day life.   Diane Moody was unaccompanied during the current feedback session. Content of the current session focused on the results of her neuropsychological evaluation. Diane Moody was given the opportunity to ask questions and her questions were answered. She was encouraged to reach out should additional questions arise. A copy of her report was  provided at the conclusion of the visit.      50 minutes were spent conducting the current feedback session with Diane Moody, billed as one unit [phone].

## 2022-05-22 ENCOUNTER — Encounter: Payer: Self-pay | Admitting: Obstetrics and Gynecology

## 2022-05-22 DIAGNOSIS — N952 Postmenopausal atrophic vaginitis: Secondary | ICD-10-CM

## 2022-05-22 NOTE — Telephone Encounter (Signed)
Dr.Jerson patient  Annual exam was 9/222 Scheduled on 9/23 Mammogram 05/21/22

## 2022-05-25 MED ORDER — ESTRADIOL 10 MCG VA TABS: 1.0000 | ORAL_TABLET | VAGINAL | 0 refills | Status: DC

## 2022-05-28 ENCOUNTER — Encounter: Payer: Self-pay | Admitting: Obstetrics and Gynecology

## 2022-06-08 ENCOUNTER — Telehealth: Payer: Self-pay

## 2022-06-08 NOTE — Telephone Encounter (Signed)
This message (Dr. Salli Quarry reply to patient's message) returned unread today. "Hi Diane Moody, I sent in enough vaginal estrogen for a year at the time of your visit in 9/22. I'm not sure what happened, but I have sent in another 3 month supply for you. Your mammogram is due, please get it scheduled. Have a great summer! Gertie Exon"  I called and left message that it returned unread and asked patient to login and read or give a call and we will read it to her.

## 2022-06-15 NOTE — Telephone Encounter (Signed)
  Last read by Chaya Jan at  8:56 AM on 06/12/2022.
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
## 2022-07-29 ENCOUNTER — Encounter: Payer: Self-pay | Admitting: Obstetrics and Gynecology

## 2022-07-29 ENCOUNTER — Ambulatory Visit (INDEPENDENT_AMBULATORY_CARE_PROVIDER_SITE_OTHER): Payer: BC Managed Care – PPO | Admitting: Obstetrics and Gynecology

## 2022-07-29 VITALS — BP 110/68 | HR 66 | Ht 65.75 in | Wt 158.0 lb

## 2022-07-29 DIAGNOSIS — R29890 Loss of height: Secondary | ICD-10-CM

## 2022-07-29 DIAGNOSIS — Z7952 Long term (current) use of systemic steroids: Secondary | ICD-10-CM

## 2022-07-29 DIAGNOSIS — Z01419 Encounter for gynecological examination (general) (routine) without abnormal findings: Secondary | ICD-10-CM

## 2022-07-29 DIAGNOSIS — E2839 Other primary ovarian failure: Secondary | ICD-10-CM | POA: Diagnosis not present

## 2022-07-29 DIAGNOSIS — N952 Postmenopausal atrophic vaginitis: Secondary | ICD-10-CM | POA: Diagnosis not present

## 2022-07-29 DIAGNOSIS — R21 Rash and other nonspecific skin eruption: Secondary | ICD-10-CM

## 2022-07-29 MED ORDER — ESTRADIOL 10 MCG VA TABS: 1.0000 | ORAL_TABLET | VAGINAL | 3 refills | Status: DC

## 2022-08-12 ENCOUNTER — Encounter: Payer: Self-pay | Admitting: Obstetrics and Gynecology

## 2022-09-09 ENCOUNTER — Encounter: Payer: Self-pay | Admitting: Obstetrics and Gynecology

## 2022-09-11 ENCOUNTER — Encounter: Payer: Self-pay | Admitting: Obstetrics and Gynecology

## 2022-09-14 NOTE — Telephone Encounter (Signed)
I will mail copy of results to patient.
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
## 2023-08-03 ENCOUNTER — Ambulatory Visit: Payer: BC Managed Care – PPO | Admitting: Obstetrics and Gynecology

## 2023-08-09 ENCOUNTER — Other Ambulatory Visit: Payer: Self-pay

## 2023-08-09 DIAGNOSIS — N952 Postmenopausal atrophic vaginitis: Secondary | ICD-10-CM

## 2023-08-09 MED ORDER — ESTRADIOL 10 MCG VA TABS
1.0000 | ORAL_TABLET | VAGINAL | 0 refills | Status: DC
Start: 2023-08-09 — End: 2024-09-12

## 2023-08-09 NOTE — Telephone Encounter (Signed)
Med refill request: Estradiol Vag Tabs Last AEX: 07/29/2022-JJ Next AEX: 09/09/2023-EB Last MMG (if hormonal med): 05/27/2022-neg birads 1 Refill authorized: rx pend

## 2023-09-03 ENCOUNTER — Encounter: Payer: Self-pay | Admitting: Obstetrics and Gynecology

## 2023-09-09 ENCOUNTER — Encounter: Payer: Self-pay | Admitting: Obstetrics and Gynecology

## 2023-09-09 ENCOUNTER — Other Ambulatory Visit (HOSPITAL_COMMUNITY)
Admission: RE | Admit: 2023-09-09 | Discharge: 2023-09-09 | Disposition: A | Discharge status: Home / Self Care | Payer: BC Managed Care – PPO | Source: Ambulatory Visit | Attending: Obstetrics and Gynecology | Admitting: Obstetrics and Gynecology

## 2023-09-09 ENCOUNTER — Ambulatory Visit: Payer: BC Managed Care – PPO | Admitting: Obstetrics and Gynecology

## 2023-09-09 VITALS — BP 130/76 | HR 61 | Ht 66.0 in | Wt 176.0 lb

## 2023-09-09 DIAGNOSIS — Z01419 Encounter for gynecological examination (general) (routine) without abnormal findings: Secondary | ICD-10-CM | POA: Insufficient documentation

## 2023-09-09 MED ORDER — INTRAROSA 6.5 MG VA INST: 1.0000 | VAGINAL_INSERT | Freq: Every evening | VAGINAL | 7 refills | Status: DC | PRN

## 2023-09-09 MED ORDER — IMVEXXY MAINTENANCE PACK 10 MCG VA INST: 1.0000 | VAGINAL_INSERT | Freq: Every evening | VAGINAL | 12 refills | Status: DC | PRN
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
## 2023-09-13 ENCOUNTER — Telehealth: Payer: Self-pay

## 2023-09-13 NOTE — Telephone Encounter (Signed)
Prior authorization was sent for Imvexxy inserts KEY: [facility]  Patient aware we are waiting on a response & that it can take up to a week to get a response back.

## 2023-09-15 LAB — CYTOLOGY - PAP
Comment: NEGATIVE
Diagnosis: NEGATIVE
High risk HPV: NEGATIVE

## 2023-09-15 NOTE — Telephone Encounter (Signed)
Prior authorization for Imvexxy was denied. Covered alternatives are Estradiol, Vagifen & Yuvafem. Please send alternative to pharmacy if appropriate

## 2023-09-16 ENCOUNTER — Other Ambulatory Visit: Payer: Self-pay

## 2023-09-16 MED ORDER — ESTRADIOL 0.1 MG/GM VA CREA: TOPICAL_CREAM | VAGINAL | 1 refills | Status: DC

## 2023-09-20 NOTE — Telephone Encounter (Signed)
Left message to callback. Dr Karma Greaser has already sent rx to the pharmacy

## 2023-09-21 NOTE — Telephone Encounter (Signed)
Patient notified

## 2023-10-13 ENCOUNTER — Encounter: Payer: Self-pay | Admitting: Obstetrics and Gynecology

## 2023-10-15 MED ORDER — INTRAROSA 6.5 MG VA INST: 1.0000 | VAGINAL_INSERT | Freq: Every evening | VAGINAL | 12 refills | Status: DC | PRN

## 2024-03-06 ENCOUNTER — Other Ambulatory Visit: Payer: Self-pay | Admitting: Obstetrics and Gynecology

## 2024-03-06 NOTE — Telephone Encounter (Signed)
 Med refill request: estrace   Last AEX: 09/09/23 Next AEX:09/12/24 Last MMG (if hormonal med)09/02/23 birads cat 1 neg  Refill authorized: last rx 09/16/23 #42.5g with 1 refill. Please approve or deny

## 2024-06-09 ENCOUNTER — Other Ambulatory Visit: Payer: Self-pay | Admitting: Obstetrics and Gynecology

## 2024-06-09 NOTE — Telephone Encounter (Signed)
 Med refill request: estrace   Last AEX: 09/09/23 Next AEX: not scheduled  Last MMG (if hormonal med) 09/02/23 BIRADS Cat 1 neg  Refill authorized: Last rx 03/06/24 42.5g with 1 refill. Please approve or deny   Note from pharmacy : ZERO refills remain on this prescription. Your patient is requesting advance approval of refills for this medication to PREVENT ANY MISSED DOSES

## 2024-08-07 ENCOUNTER — Encounter: Payer: Self-pay | Admitting: Physical Medicine & Rehabilitation

## 2024-09-01 ENCOUNTER — Encounter: Attending: Physical Medicine & Rehabilitation | Admitting: Physical Medicine & Rehabilitation

## 2024-09-11 LAB — HM DEXA SCAN: HM Dexa Scan: NORMAL

## 2024-09-11 LAB — HM MAMMOGRAPHY

## 2024-09-12 ENCOUNTER — Encounter: Payer: Self-pay | Admitting: Obstetrics and Gynecology

## 2024-09-12 ENCOUNTER — Ambulatory Visit: Payer: Self-pay | Admitting: Obstetrics and Gynecology

## 2024-09-12 ENCOUNTER — Ambulatory Visit (INDEPENDENT_AMBULATORY_CARE_PROVIDER_SITE_OTHER): Payer: BC Managed Care – PPO | Admitting: Obstetrics and Gynecology

## 2024-09-12 VITALS — BP 118/60 | HR 60 | Ht 66.54 in | Wt 185.2 lb

## 2024-09-12 DIAGNOSIS — Z1331 Encounter for screening for depression: Secondary | ICD-10-CM | POA: Diagnosis not present

## 2024-09-12 DIAGNOSIS — N9419 Other specified dyspareunia: Secondary | ICD-10-CM | POA: Diagnosis not present

## 2024-09-12 DIAGNOSIS — Z01419 Encounter for gynecological examination (general) (routine) without abnormal findings: Secondary | ICD-10-CM | POA: Diagnosis not present

## 2024-09-12 DIAGNOSIS — E2839 Other primary ovarian failure: Secondary | ICD-10-CM | POA: Diagnosis not present

## 2024-09-12 MED ORDER — ESTRADIOL 0.01 % VA CREA: 1.0000 | TOPICAL_CREAM | VAGINAL | 0 refills | Status: AC

## 2024-09-12 MED ORDER — INTRAROSA 6.5 MG VA INST: 1.0000 | VAGINAL_INSERT | Freq: Every evening | VAGINAL | 12 refills | Status: AC | PRN
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
## 2024-09-13 ENCOUNTER — Encounter: Payer: Self-pay | Admitting: Physical Medicine & Rehabilitation

## 2024-09-13 ENCOUNTER — Ambulatory Visit: Payer: Self-pay | Admitting: Obstetrics and Gynecology

## 2024-09-13 ENCOUNTER — Encounter: Payer: Self-pay | Admitting: Obstetrics and Gynecology

## 2024-09-18 ENCOUNTER — Encounter: Payer: Self-pay | Admitting: Obstetrics and Gynecology

## 2024-09-18 ENCOUNTER — Ambulatory Visit: Payer: Self-pay | Admitting: Obstetrics and Gynecology

## 2024-09-25 ENCOUNTER — Encounter: Attending: Physical Medicine & Rehabilitation | Admitting: Physical Medicine & Rehabilitation

## 2024-09-25 ENCOUNTER — Encounter: Payer: Self-pay | Admitting: Physical Medicine & Rehabilitation

## 2024-09-25 VITALS — BP 151/91 | HR 63 | Ht 66.5 in | Wt 185.8 lb

## 2024-09-25 DIAGNOSIS — G894 Chronic pain syndrome: Secondary | ICD-10-CM | POA: Diagnosis not present

## 2024-09-25 DIAGNOSIS — M170 Bilateral primary osteoarthritis of knee: Secondary | ICD-10-CM | POA: Insufficient documentation

## 2024-09-25 DIAGNOSIS — Z79891 Long term (current) use of opiate analgesic: Secondary | ICD-10-CM | POA: Insufficient documentation

## 2024-09-25 DIAGNOSIS — M797 Fibromyalgia: Secondary | ICD-10-CM | POA: Insufficient documentation

## 2024-09-25 DIAGNOSIS — Z5181 Encounter for therapeutic drug level monitoring: Secondary | ICD-10-CM | POA: Diagnosis present
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
## 2024-09-29 ENCOUNTER — Encounter: Payer: Self-pay | Admitting: Obstetrics and Gynecology

## 2024-09-29 NOTE — Telephone Encounter (Signed)
 Spoke with patient, she has spoken with pharmacy, they will run [place]  again as early as 11/27 and contact patient, so she will have enough for her trip.  Reviewed estradiol  dosing with patient, advised 1 tube will be enough for 3 months supply.  Questions answered. No additional assistance needed.   Encounter closed.

## 2024-10-09 ENCOUNTER — Encounter: Admitting: Physical Medicine & Rehabilitation
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
## 2024-12-07 ENCOUNTER — Ambulatory Visit (HOSPITAL_BASED_OUTPATIENT_CLINIC_OR_DEPARTMENT_OTHER): Payer: Self-pay | Admitting: Physical Therapy

## 2024-12-12 ENCOUNTER — Encounter: Payer: Self-pay | Admitting: Obstetrics and Gynecology

## 2024-12-13 ENCOUNTER — Encounter (HOSPITAL_BASED_OUTPATIENT_CLINIC_OR_DEPARTMENT_OTHER): Payer: Self-pay | Admitting: Physical Therapy

## 2024-12-14 ENCOUNTER — Ambulatory Visit (HOSPITAL_BASED_OUTPATIENT_CLINIC_OR_DEPARTMENT_OTHER): Payer: Self-pay | Admitting: Physical Therapy

## 2024-12-14 ENCOUNTER — Encounter (HOSPITAL_BASED_OUTPATIENT_CLINIC_OR_DEPARTMENT_OTHER): Payer: Self-pay

## 2024-12-20 ENCOUNTER — Ambulatory Visit (HOSPITAL_BASED_OUTPATIENT_CLINIC_OR_DEPARTMENT_OTHER): Payer: Self-pay | Admitting: Physical Therapy

## 2024-12-27 ENCOUNTER — Ambulatory Visit (HOSPITAL_BASED_OUTPATIENT_CLINIC_OR_DEPARTMENT_OTHER): Payer: Self-pay | Admitting: Physical Therapy

## 2025-01-09 ENCOUNTER — Ambulatory Visit (HOSPITAL_BASED_OUTPATIENT_CLINIC_OR_DEPARTMENT_OTHER): Payer: Self-pay | Admitting: Physical Therapy

## 2025-09-13 ENCOUNTER — Ambulatory Visit: Admitting: Obstetrics and Gynecology
